# Patient Record
Sex: Male | Born: 1965 | Race: White | Hispanic: No | Marital: Married | Smoking: Never smoker
Health system: Southern US, Community
[De-identification: ages and names within clinical notes are randomized; demographics above are authoritative.]

## PROBLEM LIST (undated history)

## (undated) DIAGNOSIS — M109 Gout, unspecified: Secondary | ICD-10-CM

## (undated) DIAGNOSIS — F319 Bipolar disorder, unspecified: Secondary | ICD-10-CM

---

## 2003-07-12 ENCOUNTER — Emergency Department (HOSPITAL_COMMUNITY): Admission: EM | Admit: 2003-07-12 | Discharge: 2003-07-12 | Payer: Self-pay

## 2004-07-16 ENCOUNTER — Inpatient Hospital Stay (HOSPITAL_COMMUNITY): Admission: RE | Admit: 2004-07-16 | Discharge: 2004-07-18 | Payer: Self-pay | Admitting: Psychiatry

## 2004-07-16 ENCOUNTER — Ambulatory Visit: Payer: Self-pay | Admitting: Psychiatry

## 2008-11-02 ENCOUNTER — Encounter: Admission: RE | Admit: 2008-11-02 | Discharge: 2008-11-02 | Payer: Self-pay | Admitting: Orthopedic Surgery

## 2009-02-27 ENCOUNTER — Emergency Department (HOSPITAL_COMMUNITY): Admission: EM | Admit: 2009-02-27 | Discharge: 2009-02-27 | Payer: Self-pay | Admitting: Emergency Medicine

## 2010-12-24 NOTE — Discharge Summary (Signed)
Gordon Martin, BRUMMITT NO.:  [id number]   MEDICAL RECORD NO.:  [id number]          PATIENT TYPE:  IPS   LOCATION:  0407                          FACILITY:  [facility]   PHYSICIAN:  Jasmine Pang, M.D. DATE OF BIRTH:  1965-10-16   DATE OF ADMISSION:  07/16/2004  DATE OF DISCHARGE:  07/18/2004                                 DISCHARGE SUMMARY   IDENTIFICATION:  The patient was a 45 year old single Caucasian male who was  admitted on a voluntary basis on July 16, 2004.   REASON FOR ADMISSION:  The patient presented with a history of manic  behavior.  He had left home and put 40,000 miles on his truck and had spent  $5000.  He is off of his mood stabilizers.  He also reports he is sleep-  deprived.  He was grandiose and euphoric, although somewhat irritable.   PAST PSYCHIATRIC HISTORY:  This is the first psychiatric admission to the  [facility].  He was hospitalized at [facility] and [facility] x 1.   PHYSICAL EXAMINATION:  The patient's physical examination was done in the ED  prior to transfer here.  See this report.   LABORATORY DATA:  On admission, hemogram was within normal limits.  Routine  chemistry profile was within normal limits except for a slightly elevated  AST at 39 (0-37).  TSH, free T4 and free T3 were within normal limits.  Lithium was decreased at 0.32 (0.8-1.4).  The patient had admitted to being  off of his medicines.  An ECG was done which showed sinus bradycardia and  was otherwise normal.   HOSPITAL COURSE:  Upon admission, patient was started back on Lithobid 300  mg in the morning and 600 mg q.h.s.  He was also begun on Ativan 1 mg p.o.  or IM q.6h. p.r.n. agitation.  He was started on Ambien 10 mg p.o. q.h.s.  p.r.n. insomnia.  On July 17, 2004, he was begun on Geodon 20 mg IM for  emergency sedation.  He was refusing regular medications.  He was also given  Ativan 2 mg IM for acute agitation.  A second opinion was obtained in order  to  force medications.  We decided to force medicines if he became aggressive  and/or violent.   The patient became agitated and was demanding to go home.  He became  threatening and stated he would become violent if we did not let him go.  He  also had been inappropriate with another patient by putting his hand on her  bottom.  He was agitated enough that he required forced medications.  He was  verbally aggressive toward patients on the unit.  Several patients had to be  transferred off because of his harassment and disruptive behavior.  Due to  his violence, patient had to be transferred to [facility].   DISCHARGE DIAGNOSES:   AXIS I:  Bipolar disorder, manic.   AXIS II:  Deferred.   AXIS III:  None.   AXIS IV:  Other psychosocial problems.   AXIS V:  Global Assessment of Functioning current  was 20; admission status  25; highest past year 60.   DISCHARGE MEDICATIONS:  None were written since he was being transferred to  another hospital.  They will have a copy of the medications he is on.   POST-HOSPITAL CARE PLANS:  This will be determined by [facility].      BHS/MEDQ  D:  10/21/2004  T:  10/22/2004  Job:  [id number]

## 2014-05-30 ENCOUNTER — Emergency Department (HOSPITAL_COMMUNITY)
Admission: EM | Admit: 2014-05-30 | Discharge: 2014-05-31 | Disposition: A | Discharge status: Home / Self Care | Payer: Medicare Other | Attending: Emergency Medicine | Admitting: Emergency Medicine

## 2014-05-30 ENCOUNTER — Encounter (HOSPITAL_COMMUNITY): Payer: Self-pay | Admitting: Emergency Medicine

## 2014-05-30 ENCOUNTER — Emergency Department (HOSPITAL_COMMUNITY): Payer: Medicare Other

## 2014-05-30 DIAGNOSIS — R2243 Localized swelling, mass and lump, lower limb, bilateral: Secondary | ICD-10-CM | POA: Insufficient documentation

## 2014-05-30 DIAGNOSIS — R05 Cough: Secondary | ICD-10-CM | POA: Insufficient documentation

## 2014-05-30 DIAGNOSIS — Z79899 Other long term (current) drug therapy: Secondary | ICD-10-CM | POA: Insufficient documentation

## 2014-05-30 DIAGNOSIS — R600 Localized edema: Secondary | ICD-10-CM

## 2014-05-30 DIAGNOSIS — F3112 Bipolar disorder, current episode manic without psychotic features, moderate: Secondary | ICD-10-CM

## 2014-05-30 DIAGNOSIS — R059 Cough, unspecified: Secondary | ICD-10-CM

## 2014-05-30 HISTORY — DX: Gout, unspecified: M10.9

## 2014-05-30 HISTORY — DX: Bipolar disorder, unspecified: F31.9

## 2014-05-30 NOTE — ED Notes (Signed)
[facility]  With c/o bilat lower extremity swelling and need for medical clearance per IVC. Pt reports episodes of insomnia, non compliant with medication. Pt very argumentative with staff, yelling at times. Denies SI/HI

## 2014-05-30 NOTE — ED Provider Notes (Signed)
CSN: [id number]     Arrival date & time 05/30/14  2310 History   First MD Initiated Contact with Patient 05/30/14 2334     Chief Complaint  Patient presents with  . Leg Swelling  . Medical Clearance     (Consider location/radiation/quality/duration/timing/severity/associated sxs/prior Treatment) HPI Gordon Martin is a 48 y.o. male who presents for a [facility] for evaluation of leg swelling and lab work. Patient states he just got out of jail this morning. He was transported to [facility] for mental evaluation. He does have history of bipolar, he stopped taking his medications. He was evaluated and more Narcan sent here for further evaluation of his feet and lab work. Patient denies any complaints except for pain in bilateral feet and swelling. He states he's not sure when it started to be painful or swollen, he states that he has been upright and has not laid down in 8 days while in jail. He also reports he has not slept since 1993. He does have history of gout, and believes his swelling is from gout flare. He does report some pain, states it's "all over my feet." He is not sure if he has ever had swelling of his feet in the past. He denies any fever or chills. Denies any respiratory symptoms. No other complaints. He is under involuntary commitment for bizarre behavior by moderate.  No past medical history on file. No past surgical history on file. No family history on file. History  Substance Use Topics  . Smoking status: Not on file  . Smokeless tobacco: Not on file  . Alcohol Use: Not on file    Review of Systems  Constitutional: Negative for fever and chills.  Respiratory: Positive for cough. Negative for chest tightness and shortness of breath.   Cardiovascular: Positive for leg swelling. Negative for chest pain and palpitations.  Gastrointestinal: Negative for nausea, vomiting, abdominal pain, diarrhea and abdominal distention.  Genitourinary: Negative for dysuria, urgency, frequency  and hematuria.  Musculoskeletal: Positive for arthralgias. Negative for neck pain and neck stiffness.  Skin: Negative for rash.  Allergic/Immunologic: Negative for immunocompromised state.  Neurological: Negative for dizziness, weakness, light-headedness, numbness and headaches.  Psychiatric/Behavioral: Positive for hallucinations, behavioral problems and dysphoric mood. The patient is hyperactive.       Allergies  Haloperidol and related; Risperidone and related; and Zyprexa relprevv  Home Medications   Prior to Admission medications   Medication Sig Start Date End Date Taking? Authorizing Provider  ibuprofen (ADVIL,MOTRIN) 800 MG tablet Take 800 mg by mouth every 8 (eight) hours as needed for moderate pain.   Yes Historical Provider, MD  traZODone (DESYREL) 100 MG tablet Take 100 mg by mouth at bedtime.   Yes Historical Provider, MD   BP 145/68  Pulse 71  Temp(Src) 98 F (36.7 C) (Oral)  Resp 18  SpO2 99% Physical Exam  Nursing note and vitals reviewed. Constitutional: He appears well-developed and well-nourished. No distress.  HENT:  Head: Normocephalic and atraumatic.  Eyes: Conjunctivae are normal.  Neck: Neck supple.  Cardiovascular: Normal rate, regular rhythm and normal heart sounds.   Pulmonary/Chest: Effort normal. No respiratory distress. He has no wheezes. He has no rales.  Abdominal: Soft. Bowel sounds are normal. He exhibits no distension. There is no tenderness. There is no rebound.  Musculoskeletal:  Bilateral 3+ pitting edema of feet, ankles, lower legs. Diffuse tenderness to palpation. No specific joint swelling or tenderness, no erythema or bruising  Neurological: He is alert.  Skin: Skin is  warm and dry.    ED Course  Procedures (including critical care time) Labs Review Labs Reviewed  COMPREHENSIVE METABOLIC PANEL - Abnormal; Notable for the following:    Glucose, Bld 107 (*)    All other components within normal limits  LITHIUM LEVEL - Abnormal;  Notable for the following:    Lithium Lvl 1.54 (*)    All other components within normal limits  CBC WITH DIFFERENTIAL  ETHANOL  URINE RAPID DRUG SCREEN (HOSP PERFORMED)    Imaging Review Dg Chest 2 View  05/31/2014   CLINICAL DATA:  Bilateral lower extremity swelling.  Cough.  EXAM: CHEST  2 VIEW  COMPARISON:  None.  FINDINGS: The heart size and mediastinal contours are within normal limits. Both lungs are clear. The visualized skeletal structures are unremarkable.  IMPRESSION: No active cardiopulmonary disease.   Electronically Signed   By: Charlett NoseKevin  Dover M.D.   On: 05/31/2014 00:10     EKG Interpretation None      MDM   Final diagnoses:  Cough  Bilateral edema of lower extremity  Bipolar disorder, current episode manic without psychotic features, moderate   Pt with bilateral leg swelling for unknown time. Also for labs and lithium level. Pt is IVCed.  Labs placed. No signs of infection, doubt DVT, negative homans sign. CXR ordered.    1:15 AM Lithium level just slightly high. No concern for overdose, just got out of jail and [facility] straight to [facility]. No further treatment. Pt asymptomatic for it.  Leg swelling most likely dependent edema. No signs of infection, doubt DVT, doubt gout. Will start on lasix for few days, instructed to keep legs elevated. UA pending.    Filed Vitals:   05/30/14 2320 05/30/14 2355  BP: 145/68   Pulse: 71   Temp: 98 F (36.7 C)   TempSrc: Oral   Resp: 18   Height:  5\' 11"  (1.803 m)  Weight:  222 lb 2 oz (100.755 kg)  SpO2: 99%     1:30 AM Pt in NAD. Signed out at shift change pending drug screen. Pt to be d/c back to [facility] after drug screen obtained.   Lottie Musselatyana A Zondra Lawlor, PA-C 05/31/14 0130

## 2014-05-31 LAB — CBC WITH DIFFERENTIAL/PLATELET
Basophils Absolute: 0 10*3/uL (ref 0.0–0.1)
Basophils Relative: 1 % (ref 0–1)
Eosinophils Absolute: 0.3 10*3/uL (ref 0.0–0.7)
Eosinophils Relative: 5 % (ref 0–5)
HCT: 42.2 % (ref 39.0–52.0)
Hemoglobin: 15 g/dL (ref 13.0–17.0)
Lymphocytes Relative: 28 % (ref 12–46)
Lymphs Abs: 1.9 10*3/uL (ref 0.7–4.0)
MCH: 31.8 pg (ref 26.0–34.0)
MCHC: 35.5 g/dL (ref 30.0–36.0)
MCV: 89.4 fL (ref 78.0–100.0)
Monocytes Absolute: 0.6 10*3/uL (ref 0.1–1.0)
Monocytes Relative: 9 % (ref 3–12)
Neutro Abs: 4 10*3/uL (ref 1.7–7.7)
Neutrophils Relative %: 57 % (ref 43–77)
Platelets: 234 10*3/uL (ref 150–400)
RBC: 4.72 MIL/uL (ref 4.22–5.81)
RDW: 12.1 % (ref 11.5–15.5)
WBC: 6.9 10*3/uL (ref 4.0–10.5)

## 2014-05-31 LAB — COMPREHENSIVE METABOLIC PANEL
ALT: 30 U/L (ref 0–53)
AST: 31 U/L (ref 0–37)
Albumin: 4.3 g/dL (ref 3.5–5.2)
Alkaline Phosphatase: 85 U/L (ref 39–117)
Anion gap: 15 (ref 5–15)
BUN: 18 mg/dL (ref 6–23)
CO2: 20 mEq/L (ref 19–32)
Calcium: 9.3 mg/dL (ref 8.4–10.5)
Chloride: 104 mEq/L (ref 96–112)
Creatinine, Ser: 0.98 mg/dL (ref 0.50–1.35)
GFR calc Af Amer: >90 mL/min (ref >90)
GFR calc non Af Amer: >90 mL/min (ref >90)
Glucose, Bld: 107 mg/dL — ABNORMAL HIGH (ref 70–99)
Potassium: 4.1 mEq/L (ref 3.7–5.3)
Sodium: 139 mEq/L (ref 137–147)
Total Bilirubin: 0.4 mg/dL (ref 0.3–1.2)
Total Protein: 7.3 g/dL (ref 6.0–8.3)

## 2014-05-31 LAB — ETHANOL: Alcohol, Ethyl (B): <11 mg/dL (ref 0–11)

## 2014-05-31 LAB — RAPID URINE DRUG SCREEN, HOSP PERFORMED
Amphetamines: NOT DETECTED
Barbiturates: NOT DETECTED
Benzodiazepines: NOT DETECTED
Cocaine: NOT DETECTED
Opiates: NOT DETECTED
Tetrahydrocannabinol: NOT DETECTED

## 2014-05-31 LAB — LITHIUM LEVEL: Lithium Lvl: 1.54 mEq/L (ref 0.80–1.40)

## 2014-05-31 MED ORDER — FUROSEMIDE 40 MG PO TABS: 40.0000 mg | ORAL_TABLET | Freq: Every day | ORAL | Status: DC

## 2014-05-31 MED ORDER — FUROSEMIDE 40 MG PO TABS
40.0000 mg | ORAL_TABLET | Freq: Once | ORAL | Status: AC
  Administered 2014-05-31: 40 mg via ORAL
  Filled 2014-05-31: qty 1

## 2014-05-31 NOTE — ED Provider Notes (Signed)
Medical screening examination/treatment/procedure(s) were performed by non-physician practitioner and as supervising physician I was immediately available for consultation/collaboration.   EKG Interpretation None        Tomasita CrumbleAdeleke Kataleyah Carducci, MD 05/31/14 1357

## 2014-05-31 NOTE — ED Notes (Signed)
Critical lab value lithium is 1.54. Toniann FailWendy shea call to give critical lab value.

## 2014-05-31 NOTE — Discharge Instructions (Signed)
Take lasix for your leg swelling as prescribed. Follow up with primary care doctor for recheck in one week.   Edema Edema is an abnormal buildup of fluids in your bodytissues. Edema is somewhatdependent on gravity to pull the fluid to the lowest place in your body. That makes the condition more common in the legs and thighs (lower extremities). Painless swelling of the feet and ankles is common and becomes more likely as you get older. It is also common in looser tissues, like around your eyes.  When the affected area is squeezed, the fluid may move out of that spot and leave a dent for a few moments. This dent is called pitting.  CAUSES  There are many possible causes of edema. Eating too much salt and being on your feet or sitting for a long time can cause edema in your legs and ankles. Hot weather may make edema worse. Common medical causes of edema include:  Heart failure.  Liver disease.  Kidney disease.  Weak blood vessels in your legs.  Cancer.  An injury.  Pregnancy.  Some medications.  Obesity. SYMPTOMS  Edema is usually painless.Your skin may look swollen or shiny.  DIAGNOSIS  Your health care provider may be able to diagnose edema by asking about your medical history and doing a physical exam. You may need to have tests such as X-rays, an electrocardiogram, or blood tests to check for medical conditions that may cause edema.  TREATMENT  Edema treatment depends on the cause. If you have heart, liver, or kidney disease, you need the treatment appropriate for these conditions. General treatment may include:  Elevation of the affected body part above the level of your heart.  Compression of the affected body part. Pressure from elastic bandages or support stockings squeezes the tissues and forces fluid back into the blood vessels. This keeps fluid from entering the tissues.  Restriction of fluid and salt intake.  Use of a water pill (diuretic). These medications are  appropriate only for some types of edema. They pull fluid out of your body and make you urinate more often. This gets rid of fluid and reduces swelling, but diuretics can have side effects. Only use diuretics as directed by your health care provider. HOME CARE INSTRUCTIONS   Keep the affected body part above the level of your heart when you are lying down.   Do not sit still or stand for prolonged periods.   Do not put anything directly under your knees when lying down.  Do not wear constricting clothing or garters on your upper legs.   Exercise your legs to work the fluid back into your blood vessels. This may help the swelling go down.   Wear elastic bandages or support stockings to reduce ankle swelling as directed by your health care provider.   Eat a low-salt diet to reduce fluid if your health care provider recommends it.   Only take medicines as directed by your health care provider. SEEK MEDICAL CARE IF:   Your edema is not responding to treatment.  You have heart, liver, or kidney disease and notice symptoms of edema.  You have edema in your legs that does not improve after elevating them.   You have sudden and unexplained weight gain. SEEK IMMEDIATE MEDICAL CARE IF:   You develop shortness of breath or chest pain.   You cannot breathe when you lie down.  You develop pain, redness, or warmth in the swollen areas.   You have heart, liver,  or kidney disease and suddenly get edema.  You have a fever and your symptoms suddenly get worse. MAKE SURE YOU:   Understand these instructions.  Will watch your condition.  Will get help right away if you are not doing well or get worse. Document Released: 07/25/2005 Document Revised: 12/09/2013 Document Reviewed: 05/17/2013 [facility] Patient Information 2015 [employer] [place]. This information is not intended to replace advice given to you by your health care provider. Make sure you discuss any questions you have with  your health care provider.

## 2014-05-31 NOTE — ED Notes (Signed)
Bed: [id number] Expected date:  Expected time:  Means of arrival:  Comments: tr1

## 2014-05-31 NOTE — ED Notes (Signed)
Pt refused in/out catheter.

## 2014-06-01 ENCOUNTER — Encounter (HOSPITAL_COMMUNITY): Payer: Self-pay | Admitting: Emergency Medicine

## 2014-06-01 ENCOUNTER — Emergency Department (HOSPITAL_COMMUNITY)
Admission: EM | Admit: 2014-06-01 | Discharge: 2014-06-03 | Disposition: A | Discharge status: Home / Self Care | Payer: Medicare Other | Attending: Emergency Medicine | Admitting: Emergency Medicine

## 2014-06-01 DIAGNOSIS — F23 Brief psychotic disorder: Secondary | ICD-10-CM | POA: Insufficient documentation

## 2014-06-01 DIAGNOSIS — F3112 Bipolar disorder, current episode manic without psychotic features, moderate: Secondary | ICD-10-CM | POA: Diagnosis present

## 2014-06-01 DIAGNOSIS — F3130 Bipolar disorder, current episode depressed, mild or moderate severity, unspecified: Secondary | ICD-10-CM | POA: Diagnosis present

## 2014-06-01 DIAGNOSIS — Z79899 Other long term (current) drug therapy: Secondary | ICD-10-CM | POA: Insufficient documentation

## 2014-06-01 DIAGNOSIS — Z8739 Personal history of other diseases of the musculoskeletal system and connective tissue: Secondary | ICD-10-CM | POA: Insufficient documentation

## 2014-06-01 LAB — CBC
HCT: 40.8 % (ref 39.0–52.0)
Hemoglobin: 14.3 g/dL (ref 13.0–17.0)
MCH: 31.4 pg (ref 26.0–34.0)
MCHC: 35 g/dL (ref 30.0–36.0)
MCV: 89.7 fL (ref 78.0–100.0)
Platelets: 206 10*3/uL (ref 150–400)
RBC: 4.55 MIL/uL (ref 4.22–5.81)
RDW: 12.1 % (ref 11.5–15.5)
WBC: 6.1 10*3/uL (ref 4.0–10.5)

## 2014-06-01 LAB — ACETAMINOPHEN LEVEL: Acetaminophen (Tylenol), Serum: <15 ug/mL (ref 10–30)

## 2014-06-01 LAB — COMPREHENSIVE METABOLIC PANEL
ALT: 24 U/L (ref 0–53)
AST: 24 U/L (ref 0–37)
Albumin: 4 g/dL (ref 3.5–5.2)
Alkaline Phosphatase: 70 U/L (ref 39–117)
Anion gap: 12 (ref 5–15)
BUN: 14 mg/dL (ref 6–23)
CO2: 24 mEq/L (ref 19–32)
Calcium: 9.2 mg/dL (ref 8.4–10.5)
Chloride: 105 mEq/L (ref 96–112)
Creatinine, Ser: 1.08 mg/dL (ref 0.50–1.35)
GFR calc Af Amer: >90 mL/min (ref >90)
GFR calc non Af Amer: 79 mL/min — ABNORMAL LOW (ref >90)
Glucose, Bld: 86 mg/dL (ref 70–99)
Potassium: 4.6 mEq/L (ref 3.7–5.3)
Sodium: 141 mEq/L (ref 137–147)
Total Bilirubin: 0.4 mg/dL (ref 0.3–1.2)
Total Protein: 7 g/dL (ref 6.0–8.3)

## 2014-06-01 LAB — SALICYLATE LEVEL: Salicylate Lvl: <2 mg/dL — ABNORMAL LOW (ref 2.8–20.0)

## 2014-06-01 LAB — RAPID URINE DRUG SCREEN, HOSP PERFORMED
Amphetamines: NOT DETECTED
Barbiturates: NOT DETECTED
Benzodiazepines: NOT DETECTED
Cocaine: NOT DETECTED
Opiates: NOT DETECTED
Tetrahydrocannabinol: NOT DETECTED

## 2014-06-01 LAB — LITHIUM LEVEL: Lithium Lvl: 0.32 mEq/L — ABNORMAL LOW (ref 0.80–1.40)

## 2014-06-01 LAB — ETHANOL: Alcohol, Ethyl (B): 13 mg/dL — ABNORMAL HIGH (ref 0–11)

## 2014-06-01 MED ORDER — DIPHENHYDRAMINE HCL 25 MG PO CAPS
50.0000 mg | ORAL_CAPSULE | Freq: Once | ORAL | Status: AC
  Administered 2014-06-01: 50 mg via ORAL
  Filled 2014-06-01: qty 2

## 2014-06-01 MED ORDER — LORAZEPAM 1 MG PO TABS
2.0000 mg | ORAL_TABLET | Freq: Once | ORAL | Status: AC
  Administered 2014-06-01: 2 mg via ORAL
  Filled 2014-06-01: qty 2

## 2014-06-01 MED ORDER — LORAZEPAM 2 MG/ML IJ SOLN: 2.0000 mg | Freq: Once | INTRAMUSCULAR | Status: DC

## 2014-06-01 MED ORDER — ZIPRASIDONE MESYLATE 20 MG IM SOLR
20.0000 mg | Freq: Once | INTRAMUSCULAR | Status: AC
  Administered 2014-06-01: 20 mg via INTRAMUSCULAR
  Filled 2014-06-01: qty 20

## 2014-06-01 MED ORDER — DIPHENHYDRAMINE HCL 50 MG/ML IJ SOLN
50.0000 mg | Freq: Once | INTRAMUSCULAR | Status: DC
  Filled 2014-06-01: qty 1

## 2014-06-01 MED ORDER — LORAZEPAM 2 MG/ML IJ SOLN
2.0000 mg | Freq: Once | INTRAMUSCULAR | Status: DC
  Filled 2014-06-01: qty 1

## 2014-06-01 MED ORDER — DIPHENHYDRAMINE HCL 50 MG/ML IJ SOLN: 50.0000 mg | Freq: Once | INTRAMUSCULAR | Status: DC

## 2014-06-01 MED ORDER — STERILE WATER FOR INJECTION IJ SOLN
INTRAMUSCULAR | Status: AC
  Administered 2014-06-01: 10 mL
  Filled 2014-06-01: qty 10

## 2014-06-01 NOTE — ED Notes (Signed)
Per [facility]-Pt diagnosed with bipolar disorder. Prescribed lithium er and trazadone. Pt threatening [employer], [employer] [facility] members stating that he has addresses of the staff, blood will be shed. Pt continues to verbalize intent to harm judicial and correctional staff. His behavior is unpredictable and he has been non compliant with meds. Pt smearing feces at the jail and throwing it on others.

## 2014-06-01 NOTE — ED Provider Notes (Signed)
CSN: [id number]     Arrival date & time 06/01/14  1529 History   First MD Initiated Contact with Patient 06/01/14 1539     Chief Complaint  Patient presents with  . Medical Clearance     (Consider location/radiation/quality/duration/timing/severity/associated sxs/prior Treatment) HPI 48 year old [facility]. Apparently he was threatening staff and reportedly rubbed his feces on the wall in the form of math equations. He also blacked out a window with his feces. When asked about this he states he did not have padded pencil and has had to use his stool. The patient currently believes he works for [employer]. He is rambling about different things. The security guard from [facility] states that he believes patient is supposed to have a bed here ready for him.  Past Medical History  Diagnosis Date  . Bipolar disorder   . Gout    History reviewed. No pertinent past surgical history. History reviewed. No pertinent family history. History  Substance Use Topics  . Smoking status: Never Smoker   . Smokeless tobacco: Not on file  . Alcohol Use: No    Review of Systems  Unable to perform ROS: Psychiatric disorder      Allergies  Bee venom; Haloperidol and related; Risperidone and related; and Zyprexa relprevv  Home Medications   Prior to Admission medications   Medication Sig Start Date End Date Taking? Authorizing Provider  ibuprofen (ADVIL,MOTRIN) 800 MG tablet Take 800 mg by mouth every 8 (eight) hours as needed for moderate pain.   Yes Historical Provider, MD  traZODone (DESYREL) 100 MG tablet Take 100 mg by mouth at bedtime.   Yes Historical Provider, MD  furosemide (LASIX) 40 MG tablet Take 1 tablet (40 mg total) by mouth daily. 05/31/14   Tatyana A Kirichenko, PA-C   There were no vitals taken for this visit. Physical Exam  Nursing note and vitals reviewed. Constitutional: He is oriented to person, place, and time. He appears  well-developed and well-nourished.  Patient's arms and legs are both in handcuffs  HENT:  Head: Normocephalic and atraumatic.  Right Ear: External ear normal.  Left Ear: External ear normal.  Nose: Nose normal.  Eyes: Right eye exhibits no discharge. Left eye exhibits no discharge.  Neck: Neck supple.  Cardiovascular: Normal rate, regular rhythm, normal heart sounds and intact distal pulses.   Pulmonary/Chest: Effort normal.  Abdominal: Soft. There is no tenderness.  Musculoskeletal: He exhibits no edema.  Neurological: He is alert and oriented to person, place, and time.  Skin: Skin is warm and dry.  Psychiatric: He is agitated. Thought content is delusional.    ED Course  Procedures (including critical care time) Labs Review Labs Reviewed  COMPREHENSIVE METABOLIC PANEL - Abnormal; Notable for the following:    GFR calc non Af Amer 79 (*)    All other components within normal limits  ETHANOL - Abnormal; Notable for the following:    Alcohol, Ethyl (B) 13 (*)    All other components within normal limits  SALICYLATE LEVEL - Abnormal; Notable for the following:    Salicylate Lvl <2.0 (*)    All other components within normal limits  LITHIUM LEVEL - Abnormal; Notable for the following:    Lithium Lvl 0.32 (*)    All other components within normal limits  CBC  URINE RAPID DRUG SCREEN (HOSP PERFORMED)  ACETAMINOPHEN LEVEL    Imaging Review Dg Chest 2 View  05/31/2014   CLINICAL DATA:  Bilateral lower extremity  swelling.  Cough.  EXAM: CHEST  2 VIEW  COMPARISON:  None.  FINDINGS: The heart size and mediastinal contours are within normal limits. Both lungs are clear. The visualized skeletal structures are unremarkable.  IMPRESSION: No active cardiopulmonary disease.   Electronically Signed   By: Charlett NoseKevin  Dover M.D.   On: 05/31/2014 00:10     EKG Interpretation   Date/Time:  Sunday June 01 2014 17:28:20 EDT Ventricular Rate:  55 PR Interval:  193 QRS Duration: 107 QT  Interval:  452 QTC Calculation: 432 R Axis:   56 Text Interpretation:  Sinus bradycardia No significant change since last  tracing Confirmed by Ticia Virgo  MD, Taralyn Ferraiolo ([id number]) on 06/01/2014 5:33:00 PM      MDM   Final diagnoses:  Acute psychosis    Patient with acute psychosis. Quite agitated and required IM medicines for control. After control he was sent to the psych ED was evaluated by TTS and will require inpatient psychiatric stabilization.    Audree CamelScott T Andrez Lieurance, MD 06/01/14 [phone]

## 2014-06-01 NOTE — BH Assessment (Signed)
Assessment Note  Gordon Martin is an 48 y.o. male [facility] by [facility] due to aggressive behaviors. It has been reported that pt has been threatening staff and he reportedly rubbed his feces on the wall in the form of math equations. It has also been reported that he smeared his feces over a window. It has also been reported that pt has been urinating on the floor, pulling wiring out the vents, stuffing sheets I his rectum and refusing his medication.  Pt is currently sedated and a thorough assessment could not be completed at this time. Pt denied SI and HI at this time but did not give a response for psychosis. Pt reported that his sleep has decreased and he gets approximately 4 hours of sleep.  Please see [facility]. Inpatient treatment has been recommended.  Axis I: Bipolar, Manic  Past Medical History:  Past Medical History  Diagnosis Date  . Bipolar disorder   . Gout     History reviewed. No pertinent past surgical history.  Family History: History reviewed. No pertinent family history.  Social History:  reports that he has never smoked. He does not have any smokeless tobacco history on file. He reports that he does not drink alcohol or use illicit drugs.  Additional Social History:  Alcohol / Drug Use History of alcohol / drug use?: No history of alcohol / drug abuse  CIWA: CIWA-Ar BP: 127/84 mmHg Pulse Rate: 60 COWS:    Allergies:  Allergies  Allergen Reactions  . Bee Venom     Hornets  . Haloperidol And Related Other (See Comments)    Lock jaw  . Risperidone And Related Other (See Comments)    unknown  . Zyprexa Relprevv [Olanzapine Pamoate] Other (See Comments)    unknown    Home Medications:  (Not in a hospital admission)  OB/GYN Status:  No LMP for male patient.  General Assessment Data [facility] of [facility] Is this a Tele or Face-to-Face Assessment?: Face-to-Face Is this an Initial Assessment or a Re-assessment  for this encounter?: Initial Assessment Living Arrangements: Alone Can pt return to current living arrangement?: Yes Admission Status: Involuntary Is patient capable of signing voluntary admission?: Yes Transfer from: Home Referral Source: Self/Family/Friend     [facility] Living Arrangements: Alone Name of Psychiatrist: No provider reported Name of Therapist: No provider reported  Education Status Is patient currently in school?: No  Risk to self with the past 6 months Suicidal Ideation: No Suicidal Intent: No Is patient at risk for suicide?: No Suicidal Plan?: No Access to Means: No What has been your use of drugs/alcohol within the last 12 months?: No alcohol or drug use reported Previous Attempts/Gestures: No How many times?: 0 Other Self Harm Risks: No other self harm risk identified at this time.  Triggers for Past Attempts: None known Intentional Self Injurious Behavior: None Family Suicide History: No Recent stressful life event(s): Financial Problems Persecutory voices/beliefs?: No Depression:  (unable to assess at this time. ) Substance abuse history and/or treatment for substance abuse?: No Suicide prevention information given to non-admitted patients: Not applicable  Risk to Others within the past 6 months Homicidal Ideation: No-Not Currently/Within Last 6 Months Thoughts of Harm to Others: No-Not Currently Present/Within Last 6 Months Current Homicidal Intent: No-Not Currently/Within Last 6 Months Current Homicidal Plan: No-Not Currently/Within Last 6 Months Access to Homicidal Means: No Identified Victim: N/A History of harm to others?:  (Unable to assess  at this time. ) Assessment of Violence: None Noted Violent Behavior Description: No violent behaviors observed at this time.  Does patient have access to weapons?: No Criminal Charges Pending?: No Does patient have a court date: No  Psychosis Hallucinations: None noted Delusions: None  noted  Mental Status Report Appear/Hygiene: In scrubs Eye Contact: Poor Motor Activity: Freedom of movement Speech: Incoherent;Slurred Level of Consciousness: Sedated Mood: Euthymic Affect: Appropriate to circumstance Anxiety Level: None Thought Processes: Unable to Assess Judgement: Unable to Assess Orientation: Person Obsessive Compulsive Thoughts/Behaviors: Unable to Assess  Cognitive Functioning Concentration: Unable to Assess Memory: Unable to Assess IQ: Average Insight: Unable to Assess Impulse Control: Unable to Assess Appetite:  (unable to assesss) Weight Loss: 0 Weight Gain: 0 Sleep: Decreased Total Hours of Sleep: 4 Vegetative Symptoms: None  ADLScreening [facility]) Patient's cognitive ability adequate to safely complete daily activities?: Yes Patient able to express need for assistance with ADLs?: Yes Independently performs ADLs?: Yes (appropriate for developmental age)  Prior Inpatient Therapy Prior Inpatient Therapy: Yes Prior Therapy Dates: 2005 Prior [facility]  Prior Outpatient Therapy Prior Outpatient Therapy:  (unable to assess)  ADL Screening (condition at time of admission) Patient's cognitive ability adequate to safely complete daily activities?: Yes Patient able to express need for assistance with ADLs?: Yes Independently performs ADLs?: Yes (appropriate for developmental age)       Abuse/Neglect Assessment (Assessment to be complete while patient is alone) Physical Abuse: Denies Verbal Abuse: Denies Sexual Abuse: Denies Exploitation of patient/patient's resources: Denies Self-Neglect: Denies Values / Beliefs Cultural Requests During Hospitalization: None Spiritual Requests During Hospitalization: None   Advance Directives (For Healthcare) Does patient have an advance directive?: No Would patient like information on creating an advanced directive?: No - patient declined information     Additional Information 1:1 In Past 12 Months?: No CIRT Risk: No Elopement Risk: No     Disposition:  Disposition Initial Assessment Completed for this Encounter: Yes Disposition of Patient: Inpatient treatment program Type of inpatient treatment program: Adult  On Site Evaluation by:   Reviewed with Physician:    Lahoma RockerSims,Verle Brillhart S 06/01/2014 10:51 PM

## 2014-06-01 NOTE — ED Notes (Signed)
[facility] Expected date: 06/01/14 Expected time:  Means of arrival:  Comments: Hold for room 4

## 2014-06-01 NOTE — Progress Notes (Signed)
CSW spoke with Luz Brazenarlie, RN with [facility] to collect collateral information.  She reports that patient was sent back to the ED a second time because of the increase in bizarre behaviors and critical lithium levels.  She reports [facility] he was exhibiting bizarre behaviors.  Patient was taking his roommate's belongings and hiding them, urinating on the floors, spearing feces on the walls, writing mathematic equations on the walls in feces, covering self in feces, blacking out the windows in feces, took the wiring out the vents, stuffed sheets in rectum, and refusing medications.  Patient was unmanageable in their facility therefore he was sent to the ED psych hold.    Patient is a [employer] Veteran this was confirmed with SissetonGabrielle AOD [phone].  She reports that the [facility] but has active benefits.    CSW attempted to assess but patient was being moved so will try again.    Maryelizabeth Rowanressa Dorothyann Mourer, MSW, LCSWA Evening Clinical Social Worker [phone]

## 2014-06-01 NOTE — BH Assessment (Signed)
Assessment completed. Consulted Janann Augustori Burkett, NP who recommend inpatient treatment. Dr. Criss AlvineGoldston has notified of the recommendation.

## 2014-06-02 ENCOUNTER — Encounter (HOSPITAL_COMMUNITY): Payer: Self-pay | Admitting: Psychiatry

## 2014-06-02 DIAGNOSIS — F3112 Bipolar disorder, current episode manic without psychotic features, moderate: Secondary | ICD-10-CM | POA: Diagnosis present

## 2014-06-02 DIAGNOSIS — F3130 Bipolar disorder, current episode depressed, mild or moderate severity, unspecified: Secondary | ICD-10-CM | POA: Diagnosis present

## 2014-06-02 MED ORDER — LORAZEPAM 1 MG PO TABS
1.0000 mg | ORAL_TABLET | Freq: Three times a day (TID) | ORAL | Status: DC | PRN
Start: 2014-06-02 — End: 2014-06-03
  Administered 2014-06-02 – 2014-06-03 (×3): 1 mg via ORAL
  Filled 2014-06-02 (×3): qty 1

## 2014-06-02 MED ORDER — IBUPROFEN 200 MG PO TABS: 600.0000 mg | ORAL_TABLET | Freq: Three times a day (TID) | ORAL | Status: DC | PRN

## 2014-06-02 MED ORDER — QUETIAPINE FUMARATE 50 MG PO TABS
50.0000 mg | ORAL_TABLET | Freq: Every day | ORAL | Status: DC
  Administered 2014-06-02: 50 mg via ORAL

## 2014-06-02 MED ORDER — NICOTINE 21 MG/24HR TD PT24: 21.0000 mg | MEDICATED_PATCH | Freq: Every day | TRANSDERMAL | Status: DC

## 2014-06-02 MED ORDER — ZOLPIDEM TARTRATE 5 MG PO TABS
5.0000 mg | ORAL_TABLET | Freq: Every evening | ORAL | Status: DC | PRN
  Administered 2014-06-02: 5 mg via ORAL
  Filled 2014-06-02: qty 1

## 2014-06-02 MED ORDER — DIVALPROEX SODIUM 500 MG PO DR TAB
DELAYED_RELEASE_TABLET | ORAL | Status: AC
  Filled 2014-06-02: qty 1

## 2014-06-02 MED ORDER — TRAZODONE HCL 100 MG PO TABS
100.0000 mg | ORAL_TABLET | Freq: Every day | ORAL | Status: DC
Start: 2014-06-02 — End: 2014-06-02

## 2014-06-02 MED ORDER — ONDANSETRON HCL 4 MG PO TABS: 4.0000 mg | ORAL_TABLET | Freq: Three times a day (TID) | ORAL | Status: DC | PRN

## 2014-06-02 MED ORDER — DIVALPROEX SODIUM 500 MG PO DR TAB
500.0000 mg | DELAYED_RELEASE_TABLET | Freq: Two times a day (BID) | ORAL | Status: DC
  Filled 2014-06-02: qty 1

## 2014-06-02 MED ORDER — QUETIAPINE FUMARATE 50 MG PO TABS
ORAL_TABLET | ORAL | Status: AC
  Filled 2014-06-02: qty 1

## 2014-06-02 MED ORDER — FUROSEMIDE 40 MG PO TABS
40.0000 mg | ORAL_TABLET | Freq: Every day | ORAL | Status: DC
  Administered 2014-06-02 – 2014-06-03 (×2): 40 mg via ORAL
  Filled 2014-06-02 (×3): qty 1

## 2014-06-02 MED ORDER — ALUM & MAG HYDROXIDE-SIMETH 200-200-20 MG/5ML PO SUSP: 30.0000 mL | ORAL | Status: DC | PRN

## 2014-06-02 MED ORDER — ACETAMINOPHEN 325 MG PO TABS: 650.0000 mg | ORAL_TABLET | ORAL | Status: DC | PRN

## 2014-06-02 NOTE — ED Notes (Signed)
Pt is manic pacing in the hall and speaking from one subject to another. Offered prn ativan for anxiety and agitatation. Pt refused and says that he takes trazadone for sleep. 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 Pt reports that he can teach people to fly a helicopter in 30 minutes. He then quickly changes to another subject.

## 2014-06-02 NOTE — Consult Note (Signed)
[facility] Face-to-Face Psychiatry Consult   Reason for Consult:  Mania Referring Physician:  EDP  Gordon Martin is an 48 y.o. male. Total Time spent with patient: 45 minutes  Assessment: AXIS I:  Bipolar, Manic AXIS II:  Deferred AXIS III:   Past Medical History  Diagnosis Date  . Bipolar disorder   . Gout    AXIS IV:  other psychosocial or environmental problems, problems related to social environment and problems with primary support group AXIS V:  21-30 behavior considerably influenced by delusions or hallucinations OR serious impairment in judgment, communication OR inability to function in almost all areas  Plan:  Recommend psychiatric Inpatient admission when medically cleared.  Dr. Darleene Martin assessed the patient and concurs with the plan.  Subjective:   Gordon Martin is a 48 y.o. male patient admitted with bipolar disorder and mania.  HPI:  48 y.o. male .   HPI Elements:   Location:  generalized. Quality:  acute. Severity:  severe. Timing:  constant. Duration:  one week. Context:  stressors.  Past Psychiatric History: Past Medical History  Diagnosis Date  . Bipolar disorder   . Gout     reports that he has never smoked. He does not have any smokeless tobacco history on file. He reports that he does not drink alcohol or use illicit drugs. History reviewed. No pertinent family history. Family History Substance Abuse:  (unable to assess) Family Supports:  (Unable to assess) Living Arrangements: Alone Can pt return to current living arrangement?: Yes Abuse/Neglect [facility]) Physical Abuse: Denies Verbal Abuse: Denies Sexual Abuse: Denies Allergies:   Allergies  Allergen Reactions  . Bee Venom     Hornets  . Haloperidol And Related Other (See Comments)    Lock jaw  . Risperidone And Related Other (See Comments)    unknown  . Zyprexa Relprevv [Olanzapine Pamoate] Other (See Comments)    unknown    ACT Assessment Complete:  Yes:    Educational Status    Risk to  Self: Risk to self with the past 6 months Suicidal Ideation: No Suicidal Intent: No Is patient at risk for suicide?: No Suicidal Plan?: No Access to Means: No What has been your use of drugs/alcohol within the last 12 months?: No alcohol or drug use reported Previous Attempts/Gestures: No How many times?: 0 Other Self Harm Risks: No other self harm risk identified at this time.  Triggers for Past Attempts: None known Intentional Self Injurious Behavior: None Family Suicide History: No Recent stressful life event(s): Financial Problems Persecutory voices/beliefs?: No Depression:  (unable to assess at this time. ) Substance abuse history and/or treatment for substance abuse?: No Suicide prevention information given to non-admitted patients: Not applicable  Risk to Others: Risk to Others within the past 6 months Homicidal Ideation: No-Not Currently/Within Last 6 Months Thoughts of Harm to Others: No-Not Currently Present/Within Last 6 Months Current Homicidal Intent: No-Not Currently/Within Last 6 Months Current Homicidal Plan: No-Not Currently/Within Last 6 Months Access to Homicidal Means: No Identified Victim: N/A History of harm to others?:  (Unable to assess at this time. ) Assessment of Violence: None Noted Violent Behavior Description: No violent behaviors observed at this time.  Does patient have access to weapons?: No Criminal Charges Pending?: No Does patient have a court date: No  Abuse: Abuse/Neglect Assessment (Assessment to be complete while patient is alone) Physical Abuse: Denies Verbal Abuse: Denies Sexual Abuse: Denies Exploitation of patient/patient's resources: Denies Self-Neglect: Denies  Prior Inpatient Therapy: Prior Inpatient Therapy Prior  Inpatient Therapy: Yes Prior Therapy Dates: 2005 Prior Therapy Facilty/Provider(s): [facility]  Prior Outpatient Therapy: Prior Outpatient Therapy Prior Outpatient Therapy:  (unable to assess)  Additional Information:  Additional Information 1:1 In Past 12 Months?: No CIRT Risk: No Elopement Risk: No                  Objective: Blood pressure 131/72, pulse 103, temperature 98.6 F (37 C), temperature source Oral, resp. rate 20, SpO2 97.00%.There is no weight on file to calculate BMI. Results for orders placed during the hospital encounter of 06/01/14 (from the past 72 hour(s))  CBC     Status: None   Collection Time    06/01/14  4:26 PM      Result Value Ref Range   WBC 6.1  4.0 - 10.5 K/uL   RBC 4.55  4.22 - 5.81 MIL/uL   Hemoglobin 14.3  13.0 - 17.0 g/dL   HCT 40.8  39.0 - 52.0 %   MCV 89.7  78.0 - 100.0 fL   MCH 31.4  26.0 - 34.0 pg   MCHC 35.0  30.0 - 36.0 g/dL   RDW 12.1  11.5 - 15.5 %   Platelets 206  150 - 400 K/uL  COMPREHENSIVE METABOLIC PANEL     Status: Abnormal   Collection Time    06/01/14  4:26 PM      Result Value Ref Range   Sodium 141  137 - 147 mEq/L   Potassium 4.6  3.7 - 5.3 mEq/L   Chloride 105  96 - 112 mEq/L   CO2 24  19 - 32 mEq/L   Glucose, Bld 86  70 - 99 mg/dL   BUN 14  6 - 23 mg/dL   Creatinine, Ser 1.08  0.50 - 1.35 mg/dL   Calcium 9.2  8.4 - 10.5 mg/dL   Total Protein 7.0  6.0 - 8.3 g/dL   Albumin 4.0  3.5 - 5.2 g/dL   AST 24  0 - 37 U/L   Comment: SLIGHT HEMOLYSIS     HEMOLYSIS AT THIS LEVEL MAY AFFECT RESULT   ALT 24  0 - 53 U/L   Alkaline Phosphatase 70  39 - 117 U/L   Total Bilirubin 0.4  0.3 - 1.2 mg/dL   GFR calc non Af Amer 79 (*) >90 mL/min   GFR calc Af Amer >90  >90 mL/min   Comment: (NOTE)     The eGFR has been calculated using the CKD EPI equation.     This calculation has not been validated in all clinical situations.     eGFR's persistently <90 mL/min signify possible Chronic Kidney     Disease.   Anion gap 12  5 - 15  ETHANOL     Status: Abnormal   Collection Time    06/01/14  4:26 PM      Result Value Ref Range   Alcohol, Ethyl (B) 13 (*) 0 - 11 mg/dL   Comment:            LOWEST DETECTABLE LIMIT FOR     SERUM  ALCOHOL IS 11 mg/dL     FOR MEDICAL PURPOSES ONLY  SALICYLATE LEVEL     Status: Abnormal   Collection Time    06/01/14  4:26 PM      Result Value Ref Range   Salicylate Lvl <5.0 (*) 2.8 - 20.0 mg/dL  ACETAMINOPHEN LEVEL     Status: None   Collection Time    06/01/14  4:26 PM      Result Value Ref Range   Acetaminophen (Tylenol), Serum <15.0  10 - 30 ug/mL   Comment:            THERAPEUTIC CONCENTRATIONS VARY     SIGNIFICANTLY. A RANGE OF 10-30     ug/mL MAY BE AN EFFECTIVE     CONCENTRATION FOR MANY PATIENTS.     HOWEVER, SOME ARE BEST TREATED     AT CONCENTRATIONS OUTSIDE THIS     RANGE.     ACETAMINOPHEN CONCENTRATIONS     >150 ug/mL AT 4 HOURS AFTER     INGESTION AND >50 ug/mL AT 12     HOURS AFTER INGESTION ARE     OFTEN ASSOCIATED WITH TOXIC     REACTIONS.  LITHIUM LEVEL     Status: Abnormal   Collection Time    06/01/14  4:26 PM      Result Value Ref Range   Lithium Lvl 0.32 (*) 0.80 - 1.40 mEq/L  URINE RAPID DRUG SCREEN (HOSP PERFORMED)     Status: None   Collection Time    06/01/14  5:26 PM      Result Value Ref Range   Opiates NONE DETECTED  NONE DETECTED   Cocaine NONE DETECTED  NONE DETECTED   Benzodiazepines NONE DETECTED  NONE DETECTED   Amphetamines NONE DETECTED  NONE DETECTED   Tetrahydrocannabinol NONE DETECTED  NONE DETECTED   Barbiturates NONE DETECTED  NONE DETECTED   Comment:            DRUG SCREEN FOR MEDICAL PURPOSES     ONLY.  IF CONFIRMATION IS NEEDED     FOR ANY PURPOSE, NOTIFY LAB     WITHIN 5 DAYS.                LOWEST DETECTABLE LIMITS     FOR URINE DRUG SCREEN     Drug Class       Cutoff (ng/mL)     Amphetamine      1000     Barbiturate      200     Benzodiazepine   196     Tricyclics       222     Opiates          300     Cocaine          300     THC              50   Labs are reviewed and are pertinent for no medical issues.  Current Facility-Administered Medications  Medication Dose Route Frequency Provider Last Rate Last  Dose  . acetaminophen (TYLENOL) tablet 650 mg  650 mg Oral L7L PRN Delora Fuel, MD      . alum & mag hydroxide-simeth (MAALOX/MYLANTA) 200-200-20 MG/5ML suspension 30 mL  30 mL Oral PRN Delora Fuel, MD      . divalproex (DEPAKOTE) DR tablet 500 mg  500 mg Oral BID AC Waylan Boga, NP      . furosemide (LASIX) tablet 40 mg  40 mg Oral Daily Delora Fuel, MD   40 mg at 89/21/19 1001  . ibuprofen (ADVIL,MOTRIN) tablet 600 mg  600 mg Oral E1D PRN Delora Fuel, MD      . LORazepam (ATIVAN) tablet 1 mg  1 mg Oral E0C PRN Delora Fuel, MD      . ondansetron [facility]) tablet 4 mg  4 mg Oral X4G PRN Delora Fuel, MD      .  traZODone (DESYREL) tablet 100 mg  100 mg Oral QHS Delora Fuel, MD      . zolpidem [facility]) tablet 5 mg  5 mg Oral QHS PRN Delora Fuel, MD       Current Outpatient Prescriptions  Medication Sig Dispense Refill  . ibuprofen (ADVIL,MOTRIN) 800 MG tablet Take 800 mg by mouth every 8 (eight) hours as needed for moderate pain.      . traZODone (DESYREL) 100 MG tablet Take 100 mg by mouth at bedtime.      . furosemide (LASIX) 40 MG tablet Take 1 tablet (40 mg total) by mouth daily.  10 tablet  0    Psychiatric Specialty Exam:     Blood pressure 131/72, pulse 103, temperature 98.6 F (37 C), temperature source Oral, resp. rate 20, SpO2 97.00%.There is no weight on file to calculate BMI.  General Appearance: Casual  Eye Contact::  Fair  Speech:  Pressured  Volume:  Normal  Mood:  Euphoric  Affect:  Blunt  Thought Process:  Tangential  Orientation:  Full (Time, Place, and Person)  Thought Content:  WDL  Suicidal Thoughts:  No  Homicidal Thoughts:  No  Memory:  Immediate;   Fair Recent;   Fair Remote;   Fair  Judgement:  Impaired  Insight:  Lacking  Psychomotor Activity:  Normal  Concentration:  Fair  Recall:  [employer] [place]: Fair  Akathisia:  no  Handed:  Right  AIMS (if indicated):     Assets:  Catering manager Housing Leisure  Time Physical Health Resilience Social Support  Sleep:      Musculoskeletal: Strength & Muscle Tone: within normal limits Gait & Station: normal Patient leans: N/A  Treatment Plan Summary: Daily contact with patient to assess and evaluate symptoms and progress in treatment Medication management; admit to inpatient psychiatry for stabilization  Waylan Boga, [place] 06/02/2014 2:44 PM  Patient seen, evaluated and I agree with notes by Nurse Practitioner. Corena Pilgrim, MD

## 2014-06-02 NOTE — ED Notes (Signed)
Gave pt 2 bottles of soap, 2 towels and 2 rags for a shower.
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
## 2014-06-02 NOTE — Progress Notes (Signed)
Pt has been assessed and psychiatry is recommending inpatient placement for treatment. Pt is a Thomasene RippleVeteran [facility]. [facility] [place] [facility] [place] [facility]. SW requesting a transfer packet in order to seek placement at their facility. Paperwork faxed and given to [facility] CSW, Olga CoasterKristen Reed. VA reported that they have availability and will await pt.'s information.   Derrell Lollingoris Demarkus Remmel, MSW  Social Worker [phone]
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
## 2014-06-03 DIAGNOSIS — F3110 Bipolar disorder, current episode manic without psychotic features, unspecified: Secondary | ICD-10-CM

## 2014-06-03 MED ORDER — QUETIAPINE FUMARATE 100 MG PO TABS: 200.0000 mg | ORAL_TABLET | Freq: Every day | ORAL | Status: DC

## 2014-06-03 MED ORDER — DIVALPROEX SODIUM 500 MG PO DR TAB: 500.0000 mg | DELAYED_RELEASE_TABLET | Freq: Two times a day (BID) | ORAL | Status: DC

## 2014-06-03 MED ORDER — QUETIAPINE FUMARATE 200 MG PO TABS: 200.0000 mg | ORAL_TABLET | Freq: Every day | ORAL | Status: DC

## 2014-06-03 NOTE — BH Assessment (Signed)
Patient accepted to [facility] by Bonnielee HaffBrent Fuverland, MD. Patient under IVC and will be [place] via sheriff [place] [facility] [place]. Pt will need to be transported to the ER upon [place] [facility] [facility] [place] Nursing report # [phone] ext. [phone].

## 2014-06-03 NOTE — ED Notes (Signed)
Patient pacing the hallway.  He is continuously writing notes to this Clinical research associatewriter.  His thoughts are disorganized and has flight of ideas.  His speech is rapid, pressured, tangental.  He denies any SI/HI/AVH.  He is also resisting his medication stating, "I'm not taking depakote.  I may take Lithium, but only 300 mg."  "You can tell the doctor that."  He came up to nursing station saying, "there is that [place] lady.  She's getting a porshe."

## 2014-06-03 NOTE — ED Notes (Signed)
Patient put depakote in his mouth and proceeded to spit it out refusing to take it.

## 2014-06-03 NOTE — ED Notes (Signed)
Patient to be transferred after 1900 by sheriff's department to [facility].  Report given to [facility] [phone]. [phone].  Please call her when sheriff [facility]s [facility].  Patient has written on the wall with crayon that he dug out of the trash.
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
## 2014-06-03 NOTE — BH Assessment (Signed)
06/03/2014  [facility]. [facility] No appropriate beds at this time. TTS to seek outside placement. Patient is also a [employer]an. [facility] in order to start placement process. Beds were available yesterday. Information was sent to Sarajane Jewslaura Almond, RN, PT Transfer Coordinator [phone] ext [phone]. Writer [facility] to [facility] with referral made yesterday. Per Sarajane JewsLaura Almond, RN, PT the packet was never received. Writer refaxed to  [phone].

## 2014-06-03 NOTE — ED Notes (Signed)
Patient refused vitals.

## 2014-06-03 NOTE — Consult Note (Signed)
  Psychiatric Specialty Exam: Physical Exam  ROS  Blood pressure 123/81, pulse 110, temperature 97.4 F (36.3 C), temperature source Oral, resp. rate 18, SpO2 96.00%.There is no weight on file to calculate BMI.  General Appearance: Casual  Eye Contact::  Fair  Speech:  Clear and Coherent and Pressured  Volume:  Normal  Mood:  Euphoric  Affect:  Congruent  Thought Process:  Circumstantial, Irrelevant and Tangential  Orientation:  Full (Time, Place, and Person)  Thought Content:  Negative  Suicidal Thoughts:  No  Homicidal Thoughts:  No  Memory:  Immediate;   Good Recent;   Good Remote;   Good  Judgement:  Impaired  Insight:  Lacking  Psychomotor Activity:  Increased  Concentration:  Fair  Recall:  Good  Akathisia:  Negative  Handed:  Right  AIMS (if indicated):     Assets:  Communication Skills  Sleep:   adequate  Mr Sheral FlowBentley remains manic.  His walls, bedside stand and bed are covered with pictures of women who are watching us he says.  He is constantly pacing, on the phone, gets loud quickly in an irritable way but it blows over quickly.  He talks rapidly in an irrelevant, circumstantial and tangential way.  He says he was "tortured" in jail.  "They are trying to kill me". " They means all the companies-no batteries in my vehicles- 2 [employer] trucks, 3 VW's, I paid $1000 and paid int it $ 12,000," etc, etc.  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  The plan remains to seek an inpatient bed for treatment of his bipolar mania.  He refuses Depakote but will take only 300 mg of Lithium he says.  At this point will increase the Seroquel and leave the lithium because of the toxic level it reached in the past.

## 2014-06-03 NOTE — Discharge Instructions (Signed)
Bipolar Disorder °Bipolar disorder is a mental illness. The term bipolar disorder actually is used to describe a group of disorders that all share varying degrees of emotional highs and lows that can interfere with daily functioning, such as work, school, or relationships. Bipolar disorder also can lead to drug abuse, hospitalization, and suicide. °The emotional highs of bipolar disorder are periods of elation or irritability and high energy. These highs can range from a mild form (hypomania) to a severe form (mania). People experiencing episodes of hypomania may appear energetic, excitable, and highly productive. People experiencing mania may behave impulsively or erratically. They often make poor decisions. They may have difficulty sleeping. The most severe episodes of mania can involve having very distorted beliefs or perceptions about the world and seeing or hearing things that are not real (psychotic delusions and hallucinations).  °The emotional lows of bipolar disorder (depression) also can range from mild to severe. Severe episodes of bipolar depression can involve psychotic delusions and hallucinations. °Sometimes people with bipolar disorder experience a state of mixed mood. Symptoms of hypomania or mania and depression are both present during this mixed-mood episode. °SIGNS AND SYMPTOMS °There are signs and symptoms of the episodes of hypomania and mania as well as the episodes of depression. The signs and symptoms of hypomania and mania are similar but vary in severity. They include: °· Inflated self-esteem or feeling of increased self-confidence. °· Decreased need for sleep. °· Unusual talkativeness (rapid or pressured speech) or the feeling of a need to keep talking. °· Sensation of racing thoughts or constant talking, with quick shifts between topics that may or may not be related (flight of ideas). °· Decreased ability to focus or concentrate. °· Increased purposeful activity, such as work, studies,  or social activity, or nonproductive activity, such as pacing, squirming and fidgeting, or finger and toe tapping. °· Impulsive behavior and use of poor judgment, resulting in high-risk activities, such as having unprotected sex or spending excessive amounts of money. °Signs and symptoms of depression include the following:  °· Feelings of sadness, hopelessness, or helplessness. °· Frequent or uncontrollable episodes of crying. °· Lack of feeling anything or caring about anything. °· Difficulty sleeping or sleeping too much.  °· Inability to enjoy the things you used to enjoy.   °· Desire to be alone all the time.   °· Feelings of guilt or worthlessness.  °· Lack of energy or motivation.   °· Difficulty concentrating, remembering, or making decisions.  °· Change in appetite or weight beyond normal fluctuations. °· Thoughts of death or the desire to harm yourself. °DIAGNOSIS  °Bipolar disorder is diagnosed through an assessment by your caregiver. Your caregiver will ask questions about your emotional episodes. There are two main types of bipolar disorder. People with type I bipolar disorder have manic episodes with or without depressive episodes. People with type II bipolar disorder have hypomanic episodes and major depressive episodes, which are more serious than mild depression. The type of bipolar disorder you have can make an important difference in how your illness is monitored and treated. °Your caregiver may ask questions about your medical history and use of alcohol or drugs, including prescription medication. Certain medical conditions and substances also can cause emotional highs and lows that resemble bipolar disorder (secondary bipolar disorder).  °TREATMENT  °Bipolar disorder is a long-term illness. It is best controlled with continuous treatment rather than treatment only when symptoms occur. The following treatments can be prescribed for bipolar disorders: °· Medication--Medication can be prescribed by  a doctor that   is an expert in treating mental disorders (psychiatrists). Medications called mood stabilizers are usually prescribed to help control the illness. Other medications are sometimes added if symptoms of mania, depression, or psychotic delusions and hallucinations occur despite the use of a mood stabilizer.  Talk therapy--Some forms of talk therapy are helpful in providing support, education, and guidance. A combination of medication and talk therapy is best for managing the disorder over time. A procedure in which electricity is applied to your brain through your scalp (electroconvulsive therapy) is used in cases of severe mania when medication and talk therapy do not work or work too slowly. Document Released: 10/31/2000 Document Revised: 11/19/2012 Document Reviewed: 08/20/2012 [facility] Patient Information 2015 [place]. This information is not intended to replace advice given to you by your health care provider. Make sure you discuss any questions you have with your health care provider.  Paranoia Paranoia is a distrust of others that is not based on a real reason for distrust. This may reach delusional levels. This means the paranoid person feels the world is against them when there is no reason to make them feel that way. People with paranoia feel as though people around them are "out to get them".  SIMILAR MENTAL ILNESSES  Depression is a feeling as though you are down all the time. It is normal in some situations where you have just lost a loved one. It is abnormal if you are having feelings of paranoia with it.  Dementia is a physical problem with the brain in which the brain no longer works properly. There are problems with daily activities of living. Alzheimer's disease is one example of this. Dementia is also caused by old age changes in the brain which come with the death of brain cells and small strokes.  Paranoidschizophrenia. People with paranoid schizophrenia and  persecutory delusional disorder have delusions in which they feel people around them are plotting against them. Persecutory delusions in paranoid schizophrenia are bizarre, sometimes grandiose, and often accompanied by auditory hallucinations. This means the person is hearing voices that are not there.  Delusionaldisorder (persecutory type). Delusions experienced by individuals with delusional disorder are more believable than those experienced by paranoid schizophrenics; they are not bizarre, though still unjustified. Individuals with delusional disorder may seem offbeat or quirky rather than mentally ill, and therefore, may never seek treatment. All of these problems usually do not allow these people to interact socially in an acceptable manner. CAUSES The cause of paranoia is often not known. It is common in people with extended abuse of:  Cocaine.  Amphetamine.  Marijuana.  Alcohol. Sometimes there is an inherited tendency. It may be associated with stress or changes in brain chemistry. DIAGNOSIS  When paranoia is present, your caregiver may:  Refer you to a specialist.  Do a physical exam.  Perform other tests on you to make sure there are not other problems causing the paranoia including:  Physical problems.  Mental problems.  Chemical problems (other than drugs). Testing may be done to determine if there is a psychiatric disability present that can be treated with medicine. TREATMENT   Paranoia that is a symptom of a psychiatric problem should be treated by professionals.  Medicines are available which can help this disorder. Antipsychotic medicine may be prescribed by your caregiver.  Sometimes psychotherapy may be useful.  Conditions such as depression or drug abuse are treated individually. If the paranoia is caused by drug abuse, a treatment facility may be helpful. Depression may be  helped by antidepressants. PROGNOSIS   Paranoid people are difficult to treat  because of their belief that everyone is out to get them or harm them. Because of this mistrust, they often must be talked into entering treatment by a trusted family member or friend. They may not want to take medicine as they may see this as an attempt to poison them.  Gradual gains in the trust of a therapist or caregiver helps in a successful treatment plan.  Some people with PPD or persecutory delusional disorder function in society without treatment in limited fashion. Document Released: 07/28/2003 Document Revised: 10/17/2011 Document Reviewed: 04/01/2008 [facility] Patient Information 2015 [place], [place]. This information is not intended to replace advice given to you by your health care provider. Make sure you discuss any questions you have with your health care provider.  Confusion Confusion is the inability to think with your usual speed or clarity. Confusion may come on quickly or slowly over time. How quickly the confusion comes on depends on the cause. Confusion can be due to any number of causes. CAUSES   Concussion, head injury, or head trauma.  Seizures.  Stroke.  Fever.  Brain tumor.  Age related decreased brain function (dementia).  Heightened emotional states like rage or terror.  Mental illness in which the person loses the ability to determine what is real and what is not (hallucinations).  Infections such as a urinary tract infection (UTI).  Toxic effects from alcohol, drugs, or prescription medicines.  Dehydration and an imbalance of salts in the body (electrolytes).  Lack of sleep.  Low blood sugar (diabetes).  Low levels of oxygen from conditions such as chronic lung disorders.  Drug interactions or other medicine side effects.  Nutritional deficiencies, especially niacin, thiamine, vitamin C, or vitamin B.  Sudden drop in body temperature (hypothermia).  Change in routine, such as when traveling or hospitalized. SIGNS AND SYMPTOMS  People  often describe their thinking as cloudy or unclear when they are confused. Confusion can also include feeling disoriented. That means you are unaware of where or who you are. You may also not know what the date or time is. If confused, you may also have difficulty paying attention, remembering, and making decisions. Some people also act aggressively when they are confused.  DIAGNOSIS  The medical evaluation of confusion may include:  Blood and urine tests.  X-rays.  Brain and nervous system tests.  Analyzing your brain waves (electroencephalogram or EEG).  Magnetic resonance imaging (MRI) of your head.  Computed tomography (CT) scan of your head.  Mental status tests in which your health care provider may ask many questions. Some of these questions may seem silly or strange, but they are a very important test to help diagnose and treat confusion. TREATMENT  An admission to the hospital may not be needed, but a person with confusion should not be left alone. Stay with a family member or friend until the confusion clears. Avoid alcohol, pain relievers, or sedative drugs until you have fully recovered. Do not drive until directed by your health care provider. HOME CARE INSTRUCTIONS  What family and friends can do:  To find out if someone is confused, ask the person to state his or her name, age, and the date. If the person is unsure or answers incorrectly, he or she is confused.  Always introduce yourself, no matter how well the person knows you.  Often remind the person of his or her location.  Place a calendar and clock  near the confused person.  Help the person with his or her medicines. You may want to use a pill box, an alarm as a reminder, or give the person each dose as prescribed.  Talk about current events and plans for the day.  Try to keep the environment calm, quiet, and peaceful.  Make sure the person keeps follow-up visits with his or her health care  provider. PREVENTION  Ways to prevent confusion:  Avoid alcohol.  Eat a balanced diet.  Get enough sleep.  Take medicine only as directed by your health care provider.  Do not become isolated. Spend time with other people and make plans for your days.  Keep careful watch on your blood sugar levels if you are diabetic. SEEK IMMEDIATE MEDICAL CARE IF:   You develop severe headaches, repeated vomiting, seizures, blackouts, or slurred speech.  There is increasing confusion, weakness, numbness, restlessness, or personality changes.  You develop a loss of balance, have marked dizziness, feel uncoordinated, or fall.  You have delusions, hallucinations, or develop severe anxiety.  Your family members think you need to be rechecked. Document Released: 09/01/2004 Document Revised: 12/09/2013 Document Reviewed: 08/30/2013 [facility] Patient Information 2015 [place], [place]. This information is not intended to replace advice given to you by your health care provider. Make sure you discuss any questions you have with your health care provider.  Psychosis Psychosis refers to a severe lack of understanding with reality. During a psychotic episode, you are not able to think clearly. During a psychotic episode, your responses and emotions are inappropriate and do not coincide with what is actually happening. You often have false beliefs about what is happening or who you are (delusions), and you may see, hear, taste, smell, or feel things that are not present (hallucinations). Psychosis is usually a severe symptom of a very serious mental health (psychiatric) condition, but it can sometimes be the result of a medical condition. CAUSES   Psychiatric conditions, such as:  Schizophrenia.  Bipolar disorder.  Depression.  Personality disorders.  Alcohol or drug abuse.  Medical conditions, such as:  Brain injury.  Brain tumor.  Dementia.  Brain diseases, such as Alzheimer's, Parkinson's,  or Huntington's disease.  Neurological diseases, such as epilepsy.  Genetic disorders.  Metabolic disorders.  Infections that affect the brain.  Certain prescription drugs.  Stroke. SYMPTOMS   Unable to think or speak clearly or respond appropriately.  Disorganized thinking (thoughts jump from one thought to another).  Severe inappropriate behavior.  Delusions may include:  A strong belief that is odd, unrealistic, or false.  Feeling extremely fearful or suspicious (paranoid).  Believing you are someone else, have high importance, or have an altered identity.  Hallucinations. DIAGNOSIS   Mental health evaluation.  Physical exam.  Blood tests.  Computerized magnetic scan (MRI) or other brain scans. TREATMENT  Your caregiver will recommend a course of treatment that depends on the cause of the psychosis. Treatment may include:  Monitoring and supportive care in the hospital.  Taking medicines (antipsychotic medicine) to reduce symptoms and balance chemicals in the brain.  Taking medicines to manage underlying mental health conditions.  Therapy and other supportive programs outside of the hospital.  Treating an underlying medical condition. If the cause of the psychosis can be treated or corrected, the outlook is good. Without treatment, psychotic episodes can cause danger to yourself or others. Treatment may be short-term or lifelong. HOME CARE INSTRUCTIONS   Take all medicines as directed. This is important.  Use a  pillbox or write down your medicine schedule to make sure you are taking them.  Check with your caregiver before using over-the-counter medicines, herbs, or supplements.  Seek individual and family support through therapy and mental health education (psychoeducation) programs. These will help you manage symptoms and side effects of medicines, learn life skills, and maintain a healthy routine.  Maintain a healthy lifestyle.  Exercise  regularly.  Avoid alcohol and drugs.  Learn ways to reduce stress and cope with stress, such as yoga and meditation.  Talk about your feelings with family members or caregivers.  Make time for yourself to do things you enjoy.  Know the early warning signs of psychosis. Your caregiver will recommend steps to take when you notice symptoms such as:  Feeling anxious or preoccupied.  Having racing thoughts.  Changes in your interest in life and relationships.  Follow up with your caregivers for continued outpatient treatment as directed. SEEK MEDICAL CARE IF:   Medicines do not seem to be helping.  You hear voices telling you to do things.  You see, smell, or feel things that are not there.  You feel hopeless and overwhelmed.  You feel extremely fearful and suspicious that something will harm you.  You feel like you cannot leave your house.  You have trouble taking care of yourself.  You experience side effects of medicines, such as changes in sleep patterns, dizziness, weight gain, restlessness, movement changes, muscle spasms, or tremors. SEEK IMMEDIATE MEDICAL CARE IF:  Severe psychotic symptoms present a safety issue (such as an urge to hurt yourself or others). MAKE SURE YOU:   Understand these instructions.  Will watch your condition.  Will get help right away if you are not doing well or get worse. FOR MORE INFORMATION  National Institute of Mental Health: http://www.maynard.net/ Document Released: 01/12/2010 Document Revised: 10/17/2011 Document Reviewed: 01/12/2010 [facility] Patient Information 2015 [place], [place]. This information is not intended to replace advice given to you by your health care provider. Make sure you discuss any questions you have with your health care provider.

## 2014-09-08 ENCOUNTER — Emergency Department (HOSPITAL_COMMUNITY)
Admission: EM | Admit: 2014-09-08 | Discharge: 2014-09-08 | Discharge status: Against Medical Advice | Payer: Medicare Other | Attending: Emergency Medicine | Admitting: Emergency Medicine

## 2014-09-08 ENCOUNTER — Encounter (HOSPITAL_COMMUNITY): Payer: Self-pay | Admitting: Emergency Medicine

## 2014-09-08 ENCOUNTER — Emergency Department (HOSPITAL_COMMUNITY): Payer: Medicare Other

## 2014-09-08 ENCOUNTER — Emergency Department (HOSPITAL_COMMUNITY)
Admission: EM | Admit: 2014-09-08 | Discharge: 2014-09-10 | Disposition: A | Discharge status: Home / Self Care | Payer: Medicare Other | Attending: Emergency Medicine | Admitting: Emergency Medicine

## 2014-09-08 ENCOUNTER — Encounter (HOSPITAL_COMMUNITY): Payer: Self-pay | Admitting: Adult Health

## 2014-09-08 DIAGNOSIS — F319 Bipolar disorder, unspecified: Secondary | ICD-10-CM | POA: Insufficient documentation

## 2014-09-08 DIAGNOSIS — F250 Schizoaffective disorder, bipolar type: Secondary | ICD-10-CM

## 2014-09-08 DIAGNOSIS — R2243 Localized swelling, mass and lump, lower limb, bilateral: Secondary | ICD-10-CM | POA: Insufficient documentation

## 2014-09-08 DIAGNOSIS — F259 Schizoaffective disorder, unspecified: Secondary | ICD-10-CM | POA: Diagnosis present

## 2014-09-08 DIAGNOSIS — F209 Schizophrenia, unspecified: Secondary | ICD-10-CM | POA: Insufficient documentation

## 2014-09-08 DIAGNOSIS — Z590 Homelessness: Secondary | ICD-10-CM | POA: Insufficient documentation

## 2014-09-08 DIAGNOSIS — R609 Edema, unspecified: Secondary | ICD-10-CM

## 2014-09-08 NOTE — ED Notes (Addendum)
Pt from jail (was released) via [facility] c/o bilateral ankle swelling and productive cough. Pt disruptive upon arrival  security called to bedside. Pt refuses to answer questions. When asked about what we can help him with he states "[facility]". Pt states his pain is 6/10 but will  Not tell me where. He refuses to take deep breaths for lung assessment.

## 2014-09-08 NOTE — ED Notes (Addendum)
PT refused blood pressure cuff and all monitors despite asking him multiple times and explaining what they were for. PT states he just wants his feet looked at.

## 2014-09-08 NOTE — ED Notes (Signed)
Pt provided with paper scrubs because he is in a gown.

## 2014-09-08 NOTE — ED Provider Notes (Signed)
CSN: [id number]     Arrival date & time 09/08/14  2242 History  This chart was scribed for Rolland PorterMark Elvis Laufer, MD by Evon Slackerrance Branch, ED Scribe. This patient was seen in room A05C/A05C and the patient's care was started at 11:54 PM.      Chief Complaint  Patient presents with  . Medical Clearance    psychotic   The history is provided by the patient. No language interpreter was used.   HPI Comments: Gordon Martin is a 49 y.o. male who presents to the Emergency Department complaining of bilateral foot and leg swelling. Pt states that his the swelling is worse when eating green beans or spinach.  Pt states that he has fractured ribs on the right side 2 weeks prior. Pt states that he need to be admitted to the psych ward. Pt states that he has a Hx of bipolar disorder that he use to take lithium for. Pt states that he hasn't taken lithium for several years.  Pt states that he just got out of jail this morning.    [facility].  Removed for theatening behavior.    Past Medical History  Diagnosis Date  . Bipolar disorder   . Gout    History reviewed. No pertinent past surgical history. History reviewed. No pertinent family history. History  Substance Use Topics  . Smoking status: Never Smoker   . Smokeless tobacco: Not on file  . Alcohol Use: No    Review of Systems  Constitutional: Negative for fever, chills, diaphoresis, appetite change and fatigue.  HENT: Negative for mouth sores, sore throat and trouble swallowing.   Eyes: Negative for visual disturbance.  Respiratory: Negative for cough, chest tightness, shortness of breath and wheezing.   Cardiovascular: Positive for leg swelling. Negative for chest pain.  Gastrointestinal: Negative for nausea, vomiting, abdominal pain, diarrhea and abdominal distention.  Endocrine: Negative for polydipsia, polyphagia and polyuria.  Genitourinary: Negative for dysuria, frequency and hematuria.  Musculoskeletal: Negative for gait problem.   Skin: Negative for color change, pallor and rash.  Neurological: Negative for dizziness, syncope, light-headedness and headaches.  Hematological: Does not bruise/bleed easily.  Psychiatric/Behavioral: Negative for behavioral problems and confusion.      Allergies  Bee venom; Haloperidol and related; Risperidone and related; and Zyprexa relprevv  Home Medications   Prior to Admission medications   Medication Sig Start Date End Date Taking? Authorizing Provider  divalproex (DEPAKOTE) 500 MG DR tablet Take 1 tablet (500 mg total) by mouth 2 (two) times daily before a meal. Patient not taking: Reported on 09/09/2014 06/03/14   Shuvon Rankin, NP  furosemide (LASIX) 40 MG tablet Take 1 tablet (40 mg total) by mouth daily. Patient not taking: Reported on 09/09/2014 05/31/14   Tatyana A Kirichenko, PA-C  ibuprofen (ADVIL,MOTRIN) 800 MG tablet Take 800 mg by mouth every 8 (eight) hours as needed for moderate pain.    Historical Provider, MD  QUEtiapine (SEROQUEL) 200 MG tablet Take 1 tablet (200 mg total) by mouth at bedtime. Patient not taking: Reported on 09/09/2014 06/03/14   Shuvon Rankin, NP  traZODone (DESYREL) 100 MG tablet Take 100 mg by mouth at bedtime.    Historical Provider, MD   BP 127/90 mmHg  Pulse 71  Temp(Src) 97.9 F (36.6 C) (Oral)  Resp 18  SpO2 100%   Physical Exam  Constitutional: He is oriented to person, place, and time. He appears well-developed and well-nourished. No distress.  HENT:  Head: Normocephalic.  Eyes: Conjunctivae are  normal. Pupils are equal, round, and reactive to light. No scleral icterus.  Neck: Normal range of motion. Neck supple. No thyromegaly present.  Cardiovascular: Normal rate and regular rhythm.  Exam reveals no gallop and no friction rub.   No murmur heard. Pulmonary/Chest: Effort normal and breath sounds normal. No respiratory distress. He has no wheezes. He has no rales.  Abdominal: Soft. Bowel sounds are normal. He exhibits no  distension. There is no tenderness. There is no rebound.  Musculoskeletal: Normal range of motion.  symmetric swelling of bilateral lower extremities eythema but no warmth.   Neurological: He is alert and oriented to person, place, and time.  Skin: Skin is warm and dry. No rash noted.  Psychiatric:  Disorganized thoughts, flight of ideas.     ED Course  Procedures (including critical care time) DIAGNOSTIC STUDIES: Oxygen Saturation is 97% on RA, normal by my interpretation.    COORDINATION OF CARE:  Labs Review Labs Reviewed  CBC WITH DIFFERENTIAL/PLATELET - Abnormal; Notable for the following:    RBC 3.81 (*)    Hemoglobin 11.4 (*)    HCT 33.6 (*)    All other components within normal limits  COMPREHENSIVE METABOLIC PANEL - Abnormal; Notable for the following:    Glucose, Bld 123 (*)    All other components within normal limits  ACETAMINOPHEN LEVEL - Abnormal; Notable for the following:    Acetaminophen (Tylenol), Serum <10.0 (*)    All other components within normal limits  URINALYSIS, ROUTINE W REFLEX MICROSCOPIC  ETHANOL  SALICYLATE LEVEL  URINE RAPID DRUG SCREEN (HOSP PERFORMED)    Imaging Review Dg Chest 2 View  09/08/2014   CLINICAL DATA:  Initial evaluation for bilateral foot swelling.  EXAM: CHEST  2 VIEW  COMPARISON:  Prior radiograph from 05/30/2014  FINDINGS: The cardiac and mediastinal silhouettes are stable in size and contour, and remain within normal limits.  The lungs are normally inflated. No airspace consolidation, pleural effusion, or pulmonary edema is identified. There is no pneumothorax.  No acute osseous abnormality identified.  IMPRESSION: No active cardiopulmonary disease.   Electronically Signed   By: Rise Mu M.D.   On: 09/08/2014 23:46     EKG Interpretation None      MDM   Final diagnoses:  Edema  Bipolar affective disorder, manic, moderate    Pt manic.  Dependent edema, without concern for ARF, CHF, Cellulitis, or DVT.  H/O  edema and prior diuretic use.   Medically clear for Psych eval.    Will need instruction for dependent edema (elevation, etc.) and Rx Furosemide.  (Will avoid HCTZ as pt states h/o gout)   I personally performed the services described in this documentation, which was scribed in my presence. The recorded information has been reviewed and is accurate.     Rolland Porter, MD 09/10/14 [phone]
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
## 2014-09-09 DIAGNOSIS — F22 Delusional disorders: Secondary | ICD-10-CM

## 2014-09-09 LAB — CBC WITH DIFFERENTIAL/PLATELET
Basophils Absolute: 0 10*3/uL (ref 0.0–0.1)
Basophils Relative: 0 % (ref 0–1)
Eosinophils Absolute: 0.1 10*3/uL (ref 0.0–0.7)
Eosinophils Relative: 2 % (ref 0–5)
HCT: 33.6 % — ABNORMAL LOW (ref 39.0–52.0)
HEMOGLOBIN: 11.4 g/dL — AB (ref 13.0–17.0)
Lymphocytes Relative: 25 % (ref 12–46)
Lymphs Abs: 1.2 10*3/uL (ref 0.7–4.0)
MCH: 29.9 pg (ref 26.0–34.0)
MCHC: 33.9 g/dL (ref 30.0–36.0)
MCV: 88.2 fL (ref 78.0–100.0)
MONO ABS: 0.6 10*3/uL (ref 0.1–1.0)
Monocytes Relative: 12 % (ref 3–12)
NEUTROS PCT: 61 % (ref 43–77)
Neutro Abs: 2.9 10*3/uL (ref 1.7–7.7)
Platelets: 222 10*3/uL (ref 150–400)
RBC: 3.81 MIL/uL — ABNORMAL LOW (ref 4.22–5.81)
RDW: 13.7 % (ref 11.5–15.5)
WBC: 4.8 10*3/uL (ref 4.0–10.5)

## 2014-09-09 LAB — RAPID URINE DRUG SCREEN, HOSP PERFORMED
AMPHETAMINES: NOT DETECTED
Barbiturates: NOT DETECTED
Benzodiazepines: NOT DETECTED
COCAINE: NOT DETECTED
Opiates: NOT DETECTED
Tetrahydrocannabinol: NOT DETECTED

## 2014-09-09 LAB — COMPREHENSIVE METABOLIC PANEL
ALT: 10 U/L (ref 0–53)
ANION GAP: 10 (ref 5–15)
AST: 16 U/L (ref 0–37)
Albumin: 3.5 g/dL (ref 3.5–5.2)
Alkaline Phosphatase: 75 U/L (ref 39–117)
BUN: 17 mg/dL (ref 6–23)
CHLORIDE: 105 mmol/L (ref 96–112)
CO2: 24 mmol/L (ref 19–32)
Calcium: 9.2 mg/dL (ref 8.4–10.5)
Creatinine, Ser: 0.93 mg/dL (ref 0.50–1.35)
GFR calc Af Amer: >90 mL/min (ref >90)
GFR calc non Af Amer: >90 mL/min (ref >90)
Glucose, Bld: 123 mg/dL — ABNORMAL HIGH (ref 70–99)
Potassium: 3.5 mmol/L (ref 3.5–5.1)
Sodium: 139 mmol/L (ref 135–145)
Total Bilirubin: 0.7 mg/dL (ref 0.3–1.2)
Total Protein: 6.1 g/dL (ref 6.0–8.3)

## 2014-09-09 LAB — URINALYSIS, ROUTINE W REFLEX MICROSCOPIC
Bilirubin Urine: NEGATIVE
GLUCOSE, UA: NEGATIVE mg/dL
HGB URINE DIPSTICK: NEGATIVE
Ketones, ur: NEGATIVE mg/dL
Leukocytes, UA: NEGATIVE
Nitrite: NEGATIVE
PH: 7 (ref 5.0–8.0)
Protein, ur: NEGATIVE mg/dL
Specific Gravity, Urine: 1.006 (ref 1.005–1.030)
Urobilinogen, UA: 0.2 mg/dL (ref 0.0–1.0)

## 2014-09-09 LAB — ACETAMINOPHEN LEVEL

## 2014-09-09 LAB — SALICYLATE LEVEL: Salicylate Lvl: <4 mg/dL (ref 2.8–20.0)

## 2014-09-09 LAB — ETHANOL

## 2014-09-09 MED ORDER — DIVALPROEX SODIUM 500 MG PO DR TAB
500.0000 mg | DELAYED_RELEASE_TABLET | Freq: Two times a day (BID) | ORAL | Status: DC
  Filled 2014-09-09 (×2): qty 1
  Filled 2014-09-09: qty 2

## 2014-09-09 MED ORDER — FUROSEMIDE 40 MG PO TABS
40.0000 mg | ORAL_TABLET | Freq: Every day | ORAL | Status: DC
  Administered 2014-09-09 – 2014-09-10 (×2): 40 mg via ORAL
  Filled 2014-09-09: qty 1
  Filled 2014-09-09: qty 2

## 2014-09-09 MED ORDER — QUETIAPINE FUMARATE 100 MG PO TABS
200.0000 mg | ORAL_TABLET | Freq: Every day | ORAL | Status: DC
  Filled 2014-09-09: qty 2

## 2014-09-09 MED ORDER — IBUPROFEN 200 MG PO TABS
600.0000 mg | ORAL_TABLET | Freq: Three times a day (TID) | ORAL | Status: DC | PRN
  Administered 2014-09-09: 600 mg via ORAL
  Filled 2014-09-09 (×2): qty 1

## 2014-09-09 MED ORDER — IBUPROFEN 800 MG PO TABS
800.0000 mg | ORAL_TABLET | Freq: Once | ORAL | Status: AC
Start: 2014-09-09 — End: 2014-09-09
  Administered 2014-09-09: 800 mg via ORAL
  Filled 2014-09-09: qty 1

## 2014-09-09 MED ORDER — LORAZEPAM 1 MG PO TABS
1.0000 mg | ORAL_TABLET | ORAL | Status: DC | PRN
  Administered 2014-09-09 (×2): 1 mg via ORAL
  Filled 2014-09-09 (×2): qty 1

## 2014-09-09 MED ORDER — TRAZODONE HCL 100 MG PO TABS
100.0000 mg | ORAL_TABLET | Freq: Every day | ORAL | Status: DC
  Filled 2014-09-09: qty 1

## 2014-09-09 MED ORDER — ZIPRASIDONE MESYLATE 20 MG IM SOLR: 20.0000 mg | Freq: Four times a day (QID) | INTRAMUSCULAR | Status: DC | PRN

## 2014-09-09 NOTE — ED Provider Notes (Signed)
Pt manic, endorsing SI to staff, per psych recs [facility].   1. Edema   2. Bipolar affective disorder, manic, moderate      Toy CookeyMegan Docherty, MD 09/09/14 1800

## 2014-09-09 NOTE — ED Notes (Signed)
Security called to room

## 2014-09-09 NOTE — ED Notes (Signed)
Staffing notified of need of a sitter.

## 2014-09-09 NOTE — ED Notes (Signed)
Minerva AreolaEric, RN, Consulting civil engineerCharge RN, and Dr Micheline Mazeocherty aware [facility].
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
## 2014-09-09 NOTE — ED Notes (Signed)
Pt is a 49 year old voluntary admit from [facility]. Pt presents stating, I am the president and I will hire you to be my commander and chief and pay you 13 million dollars." Pt is very manic on and off the phone. He presents with a positive cough and can be intrusive with the other pts. He denies Si and HI. Pt was served a dinner tray and taken to his room. He has been pacing in hall.

## 2014-09-09 NOTE — ED Notes (Signed)
Called staffing to report IVC. Dr. Fayrene FearingJames completing IVC forms.  Also reported patient's request for motrin for pain management. MD acknowledges.

## 2014-09-09 NOTE — ED Notes (Signed)
Per Thayer Ohmhris, patient is declined [facility] due to acuity.

## 2014-09-09 NOTE — ED Notes (Signed)
telepsych being conducted at the bedside.  
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
## 2014-09-09 NOTE — ED Notes (Signed)
Per Corrie DandyMary at [facility], pt meets inpatient criteria, and they are looking for a bed [facility] for him. If no bed at [facility], then placement will be sought elsewhere. Mary instructs that the pt should be IVC. Fayrene FearingJames, MD notified.

## 2014-09-09 NOTE — ED Notes (Signed)
Dr. James at the bedside.  
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
## 2014-09-09 NOTE — Progress Notes (Signed)
Pt referral also faxed to the following facilities:   [facility]  Will continue to seek placement.  Chad CordialLauren Carter, LCSWA 09/09/2014 2:50 PM

## 2014-09-09 NOTE — Progress Notes (Signed)
CSW left message for Transfer staff at [facility] in attempts to check on bed availability and get correct fax information at approximately 8:30am. Still have not received call back. Will fax [facility] to [facility].  Pt [facility] also [facility] to [facility], who per [place], have bed availability.   Will continue to seek placement.  Chad CordialLauren Carter, LCSWA 09/09/2014 2:32 PM

## 2014-09-09 NOTE — ED Notes (Signed)
[facility] HAS ARRIVED TO TRANSPORT PT.

## 2014-09-09 NOTE — ED Notes (Signed)
Pt sleeping in bed. Respirations at 16 per minute, unlabored.

## 2014-09-09 NOTE — ED Notes (Signed)
telepsych still being conducted.

## 2014-09-09 NOTE — ED Notes (Addendum)
PT'S NEIGHBOR Jillyn Hidden- GARY FUTRELL - [phone] - STATES PT LIVES [place] [address] FROM HIM - [address] - CORRECTED W/REGISTRATION.   PT'S BROTHER - TY - [phone], PER GARY.
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
## 2014-09-10 ENCOUNTER — Encounter (HOSPITAL_COMMUNITY): Payer: Self-pay | Admitting: Emergency Medicine

## 2014-09-10 ENCOUNTER — Emergency Department (HOSPITAL_COMMUNITY)
Admission: EM | Admit: 2014-09-10 | Discharge: 2014-09-10 | Discharge status: Against Medical Advice | Payer: Medicare Other | Attending: Emergency Medicine | Admitting: Emergency Medicine

## 2014-09-10 DIAGNOSIS — Z590 Homelessness: Secondary | ICD-10-CM | POA: Insufficient documentation

## 2014-09-10 DIAGNOSIS — F250 Schizoaffective disorder, bipolar type: Secondary | ICD-10-CM | POA: Insufficient documentation

## 2014-09-10 DIAGNOSIS — F259 Schizoaffective disorder, unspecified: Secondary | ICD-10-CM

## 2014-09-10 NOTE — ED Notes (Signed)
Pt states he just wants a meal. Sandwich, coke, peanut butter given along with bus pass pt discharged. PA aware.
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
## 2014-09-10 NOTE — BHH Counselor (Signed)
TTS Counselor contacted the following facilities in effort to place pt:  Beds available now or possibly in the morning, [facility]    [facility]

## 2014-09-10 NOTE — ED Notes (Addendum)
Pt reports he cannot find the keys to his home. Pt states he is not SI/HI.

## 2014-09-10 NOTE — ED Notes (Signed)
Pt is grandiose and delusional in thinking saying that he is the "president", "Karin GoldenHarris Teeter is broke" and "no more money going to stupid jew people" Pt refused morning depakote and reported to MD. Pt denies si and hi. Safety maintained.

## 2014-09-10 NOTE — Progress Notes (Signed)
Pt IVC rescinded.  Byrd HesselbachKristen Mardell Cragg, LCSW [phone]  ED CSW 09/10/2014 11:15 AM

## 2014-09-10 NOTE — BHH Suicide Risk Assessment (Signed)
Suicide Risk Assessment  Discharge Assessment   [facility]   Demographic Factors:  Male, Caucasian and Low socioeconomic status  Total Time spent with patient: 45 minutes  Musculoskeletal: Strength & Muscle Tone: within normal limits Gait & Station: normal Patient leans: N/A  Psychiatric Specialty Exam:     Blood pressure 118/61, pulse 76, temperature 97.8 F (36.6 C), temperature source Oral, resp. rate 20, SpO2 100 %.There is no weight on file to calculate BMI.  General Appearance: Casual  Eye Contact::  Good  Speech:  Normal Rate  Volume:  Normal  Mood:  Euthymic  Affect:  Congruent  Thought Process:  Coherent  Orientation:  Full (Time, Place, and Person)  Thought Content:  WDL  Suicidal Thoughts:  No  Homicidal Thoughts:  No  Memory:  Immediate;   Good Recent;   Good Remote;   Good  Judgement:  Fair  Insight:  Fair  Psychomotor Activity:  Normal  Concentration:  Good  Recall:  Good  Fund of Knowledge:Good  Language: Good  Akathisia:  No  Handed:  Right  AIMS (if indicated):     Assets:  Leisure Time Physical Health Resilience  ADL's:  Intact  Cognition: WNL  Sleep:         Has this patient used any form of tobacco in the last 30 days? (Cigarettes, Smokeless Tobacco, Cigars, and/or Pipes) Yes, A prescription for an FDA-approved tobacco cessation medication was offered at discharge and the patient refused  Mental Status Per Nursing Assessment::   On Admission:   Requested admission for his bipolar disorder  Current Mental Status by Physician: NA  Loss Factors: NA  Historical Factors: NA  Risk Reduction Factors:   Positive coping skills or problem solving skills  Continued Clinical Symptoms:  None  Cognitive Features That Contribute To Risk:  None    Suicide Risk:  Minimal: No identifiable suicidal ideation.  Patients presenting with no risk factors but with morbid ruminations; may be classified as minimal risk  based on the severity of the depressive symptoms  Principal Problem: Schizoaffective disorder Discharge Diagnoses:  Patient Active Problem List   Diagnosis Date Noted  . Schizoaffective disorder [F25.9] 09/10/2014    Priority: High  . Schizoaffective disorder, bipolar type [F25.0]       Plan Of Care/Follow-up recommendations:  Activity:  as tolerated Diet:  heart healthy diet  Is patient on multiple antipsychotic therapies at discharge:  No   Has Patient had three or more failed trials of antipsychotic monotherapy by history:  No  Recommended Plan for Multiple Antipsychotic Therapies: NA    Roc Streett, PMH-NP 09/10/2014, 11:03 AM

## 2014-09-10 NOTE — Progress Notes (Signed)
CSW met with pt at bedside. Per psychiatrist, patient psychiatrically stable for discharge home. CSW offered pt outpatient resources. CSW and pt discussed [facility], pt stated, "I dont want to go there." patient inquired about the heat bus to go to [place]. CSW explain that the heat bus is only be for local schools. Patient stated CSW was wrong. CSW directed patient to follow up with heat. Pt accepted outpatient resource sheet and bus pass.   Noreene Larsson [phone]  ED CSW 09/10/2014 10:00 AM

## 2014-09-11 ENCOUNTER — Encounter (HOSPITAL_COMMUNITY): Payer: Self-pay | Admitting: Emergency Medicine

## 2014-09-11 ENCOUNTER — Emergency Department (HOSPITAL_COMMUNITY)
Admission: EM | Admit: 2014-09-11 | Discharge: 2014-09-11 | Disposition: A | Discharge status: Home / Self Care | Payer: Medicare Other | Attending: Emergency Medicine | Admitting: Emergency Medicine

## 2014-09-11 DIAGNOSIS — R600 Localized edema: Secondary | ICD-10-CM | POA: Insufficient documentation

## 2014-09-11 DIAGNOSIS — M79662 Pain in left lower leg: Secondary | ICD-10-CM | POA: Insufficient documentation

## 2014-09-11 DIAGNOSIS — Z79899 Other long term (current) drug therapy: Secondary | ICD-10-CM | POA: Insufficient documentation

## 2014-09-11 DIAGNOSIS — R609 Edema, unspecified: Secondary | ICD-10-CM

## 2014-09-11 DIAGNOSIS — M109 Gout, unspecified: Secondary | ICD-10-CM | POA: Insufficient documentation

## 2014-09-11 DIAGNOSIS — M79661 Pain in right lower leg: Secondary | ICD-10-CM | POA: Insufficient documentation

## 2014-09-11 DIAGNOSIS — F319 Bipolar disorder, unspecified: Secondary | ICD-10-CM | POA: Insufficient documentation

## 2014-09-11 DIAGNOSIS — Z008 Encounter for other general examination: Secondary | ICD-10-CM

## 2014-09-11 LAB — ETHANOL: Alcohol, Ethyl (B): <5 mg/dL (ref 0–9)

## 2014-09-11 LAB — COMPREHENSIVE METABOLIC PANEL
ALBUMIN: 4.1 g/dL (ref 3.5–5.2)
ALT: 11 U/L (ref 0–53)
AST: 17 U/L (ref 0–37)
Alkaline Phosphatase: 84 U/L (ref 39–117)
Anion gap: 6 (ref 5–15)
BILIRUBIN TOTAL: 0.5 mg/dL (ref 0.3–1.2)
BUN: 20 mg/dL (ref 6–23)
CALCIUM: 9.3 mg/dL (ref 8.4–10.5)
CO2: 29 mmol/L (ref 19–32)
CREATININE: 0.76 mg/dL (ref 0.50–1.35)
Chloride: 110 mmol/L (ref 96–112)
GLUCOSE: 103 mg/dL — AB (ref 70–99)
Potassium: 4.2 mmol/L (ref 3.5–5.1)
SODIUM: 145 mmol/L (ref 135–145)
Total Protein: 6.9 g/dL (ref 6.0–8.3)

## 2014-09-11 LAB — RAPID URINE DRUG SCREEN, HOSP PERFORMED
AMPHETAMINES: NOT DETECTED
BENZODIAZEPINES: NOT DETECTED
Barbiturates: NOT DETECTED
COCAINE: NOT DETECTED
OPIATES: NOT DETECTED
Tetrahydrocannabinol: NOT DETECTED

## 2014-09-11 LAB — SALICYLATE LEVEL: Salicylate Lvl: <4 mg/dL (ref 2.8–20.0)

## 2014-09-11 LAB — CBC
HCT: 37.1 % — ABNORMAL LOW (ref 39.0–52.0)
Hemoglobin: 12.1 g/dL — ABNORMAL LOW (ref 13.0–17.0)
MCH: 29.9 pg (ref 26.0–34.0)
MCHC: 32.6 g/dL (ref 30.0–36.0)
MCV: 91.6 fL (ref 78.0–100.0)
PLATELETS: 262 10*3/uL (ref 150–400)
RBC: 4.05 MIL/uL — AB (ref 4.22–5.81)
RDW: 14 % (ref 11.5–15.5)
WBC: 4.7 10*3/uL (ref 4.0–10.5)

## 2014-09-11 LAB — ACETAMINOPHEN LEVEL: Acetaminophen (Tylenol), Serum: <10 ug/mL — ABNORMAL LOW (ref 10–30)

## 2014-09-11 MED ORDER — FUROSEMIDE 40 MG PO TABS: 40.0000 mg | ORAL_TABLET | Freq: Every day | ORAL | Status: DC

## 2014-09-11 NOTE — ED Provider Notes (Signed)
CSN: [id number]     Arrival date & time 09/11/14  0104 History   First MD Initiated Contact with Patient 09/11/14 0138     Chief Complaint  Patient presents with  . Medical Clearance     (Consider location/radiation/quality/duration/timing/severity/associated sxs/prior Treatment) HPI Comments: Patient is a 49 year old male with history of bipolar disorder and gout. He was released from the emergency department at 2000 yesterday. He returns to the emergency department escorted by [employer]. [employer] states that they brought the patient to jail for a warrant and was told by the nurse that he needed to be medically cleared for his bilateral lower extremity edema. Per prior notes, patient has been noted to have dependent edema. He was supposed to be discharged with a course of Lasix. It does not appear that this was given to the patient after discharged by psychiatry. Patient has no additional complaints at this time. He denies any worsening of his edema since he was last seen. He does report being on Lasix in the past; he believes it was when he was last in jail. He is requesting to Cokes and a Sprite. He states that he is also hungry.  The history is provided by the patient and the police. No language interpreter was used.    Past Medical History  Diagnosis Date  . Bipolar disorder   . Gout    History reviewed. No pertinent past surgical history. History reviewed. No pertinent family history. History  Substance Use Topics  . Smoking status: Never Smoker   . Smokeless tobacco: Not on file  . Alcohol Use: No    Review of Systems  Cardiovascular: Positive for leg swelling.  All other systems reviewed and are negative.   Allergies  Bee venom; Haloperidol and related; Risperidone and related; and Zyprexa relprevv  Home Medications   Prior to Admission medications   Medication Sig Start Date End Date Taking? Authorizing Provider  divalproex (DEPAKOTE) 500 MG DR tablet Take 1 tablet (500 mg  total) by mouth 2 (two) times daily before a meal. Patient not taking: Reported on 09/09/2014 06/03/14   Shuvon Rankin, NP  furosemide (LASIX) 40 MG tablet Take 1 tablet (40 mg total) by mouth daily. Patient not taking: Reported on 09/09/2014 05/31/14   Tatyana A Kirichenko, PA-C  ibuprofen (ADVIL,MOTRIN) 800 MG tablet Take 800 mg by mouth every 8 (eight) hours as needed for moderate pain.    Historical Provider, MD  QUEtiapine (SEROQUEL) 200 MG tablet Take 1 tablet (200 mg total) by mouth at bedtime. Patient not taking: Reported on 09/09/2014 06/03/14   Shuvon Rankin, NP  traZODone (DESYREL) 100 MG tablet Take 100 mg by mouth at bedtime.    Historical Provider, MD   BP 135/81 mmHg  Pulse 80  Temp(Src) 98.6 F (37 C) (Oral)  Resp 16  SpO2 100%   Physical Exam  Constitutional: He is oriented to person, place, and time. He appears well-developed and well-nourished. No distress.  Nontoxic/nonseptic appearing  HENT:  Head: Normocephalic and atraumatic.  Eyes: Conjunctivae and EOM are normal. No scleral icterus.  Neck: Normal range of motion.  Cardiovascular: Normal rate, regular rhythm and intact distal pulses.   DP and PT pulses faint, but palpable; limited secondary to pitting edema.  Pulmonary/Chest: Effort normal. No respiratory distress.  Respirations even and unlabored  Musculoskeletal: Normal range of motion. He exhibits tenderness.  Pitting edema 3+ in b/l lower extremities up to proximal calf. Mild associated tenderness with deep palpation only.  Neurological: He  is alert and oriented to person, place, and time. He exhibits normal muscle tone. Coordination normal.  Sensation to light touch intact. Patient able to wiggle all toes bilaterally. He is ambulatory with steady gait.  Skin: Skin is warm and dry. No rash noted. He is not diaphoretic. No erythema. No pallor.  Psychiatric: He has a normal mood and affect. His behavior is normal.  Nursing note and vitals reviewed.   ED Course   Procedures (including critical care time) Labs Review Labs Reviewed  ACETAMINOPHEN LEVEL - Abnormal; Notable for the following:    Acetaminophen (Tylenol), Serum <10.0 (*)    All other components within normal limits  CBC - Abnormal; Notable for the following:    RBC 4.05 (*)    Hemoglobin 12.1 (*)    HCT 37.1 (*)    All other components within normal limits  COMPREHENSIVE METABOLIC PANEL - Abnormal; Notable for the following:    Glucose, Bld 103 (*)    All other components within normal limits  ETHANOL  SALICYLATE LEVEL  URINE RAPID DRUG SCREEN (HOSP PERFORMED)    Imaging Review No results found.   EKG Interpretation None      MDM   Final diagnoses:  Dependent edema  Medical clearance for incarceration    49 year old male presents to the emergency department for hours after discharge following psychiatric evaluation. He is escorted by [employer] who is requesting medical clearance for incarceration because of his lower extremity edema. Patient's lower extremity edema was noted during his prior visit. His laboratory workup today is consistent with his baseline. He has no tachycardia, tachypnea, dyspnea, or hypoxia to suggest increased vascular congestion as a result of his lower extremity edema. He is neurovascularly intact on my exam. No evidence of secondary cellulitis.  Patient was not provided a prescription for Lasix at his prior discharge. Patient will be started on 40 mg Lasix daily for management of his dependent edema. No indication for further emergent workup at this time. Patient is appropriate for discharge to jail in the custody of [employer].   Filed Vitals:   09/11/14 0115  BP: 135/81  Pulse: 80  Temp: 98.6 F (37 C)  TempSrc: Oral  Resp: 16  SpO2: 100%       Antony Madura, PA-C 09/12/14 1610  Ward Givens, MD 09/12/14 [phone]

## 2014-09-11 NOTE — Discharge Instructions (Signed)

## 2014-09-11 NOTE — ED Notes (Addendum)
Pt escorted by [employer] needs to be medically cleared due to bilateral lower extremity swelling before he can be accepted to jail.

## 2015-11-15 IMAGING — CR DG CHEST 2V
2 series · 2 of 2 positions shown · non-contrast
Comparison: None.

CLINICAL DATA: Bilateral lower extremity swelling.  Cough.

EXAM:
CHEST  2 VIEW

[w chest pa]
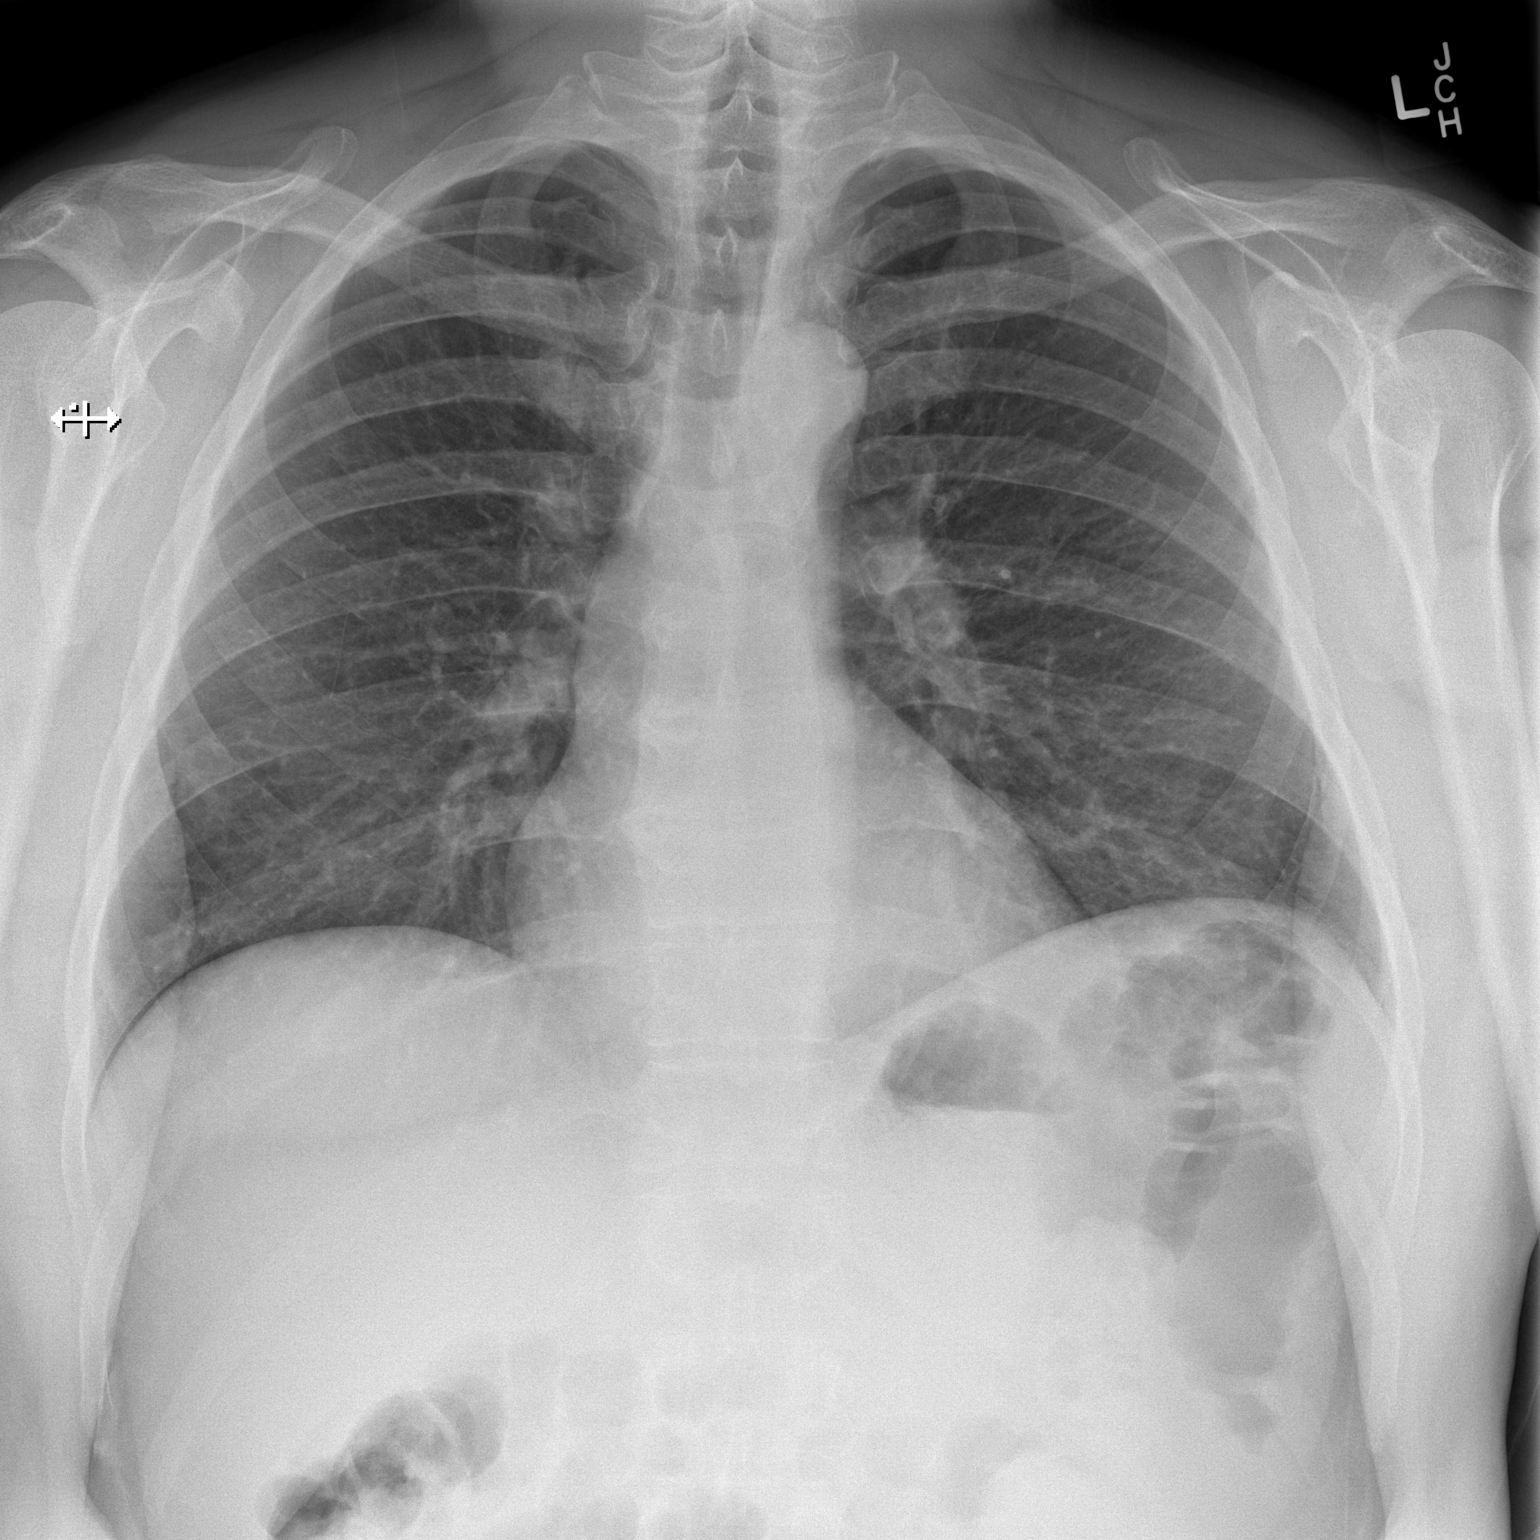

[w chest lat]
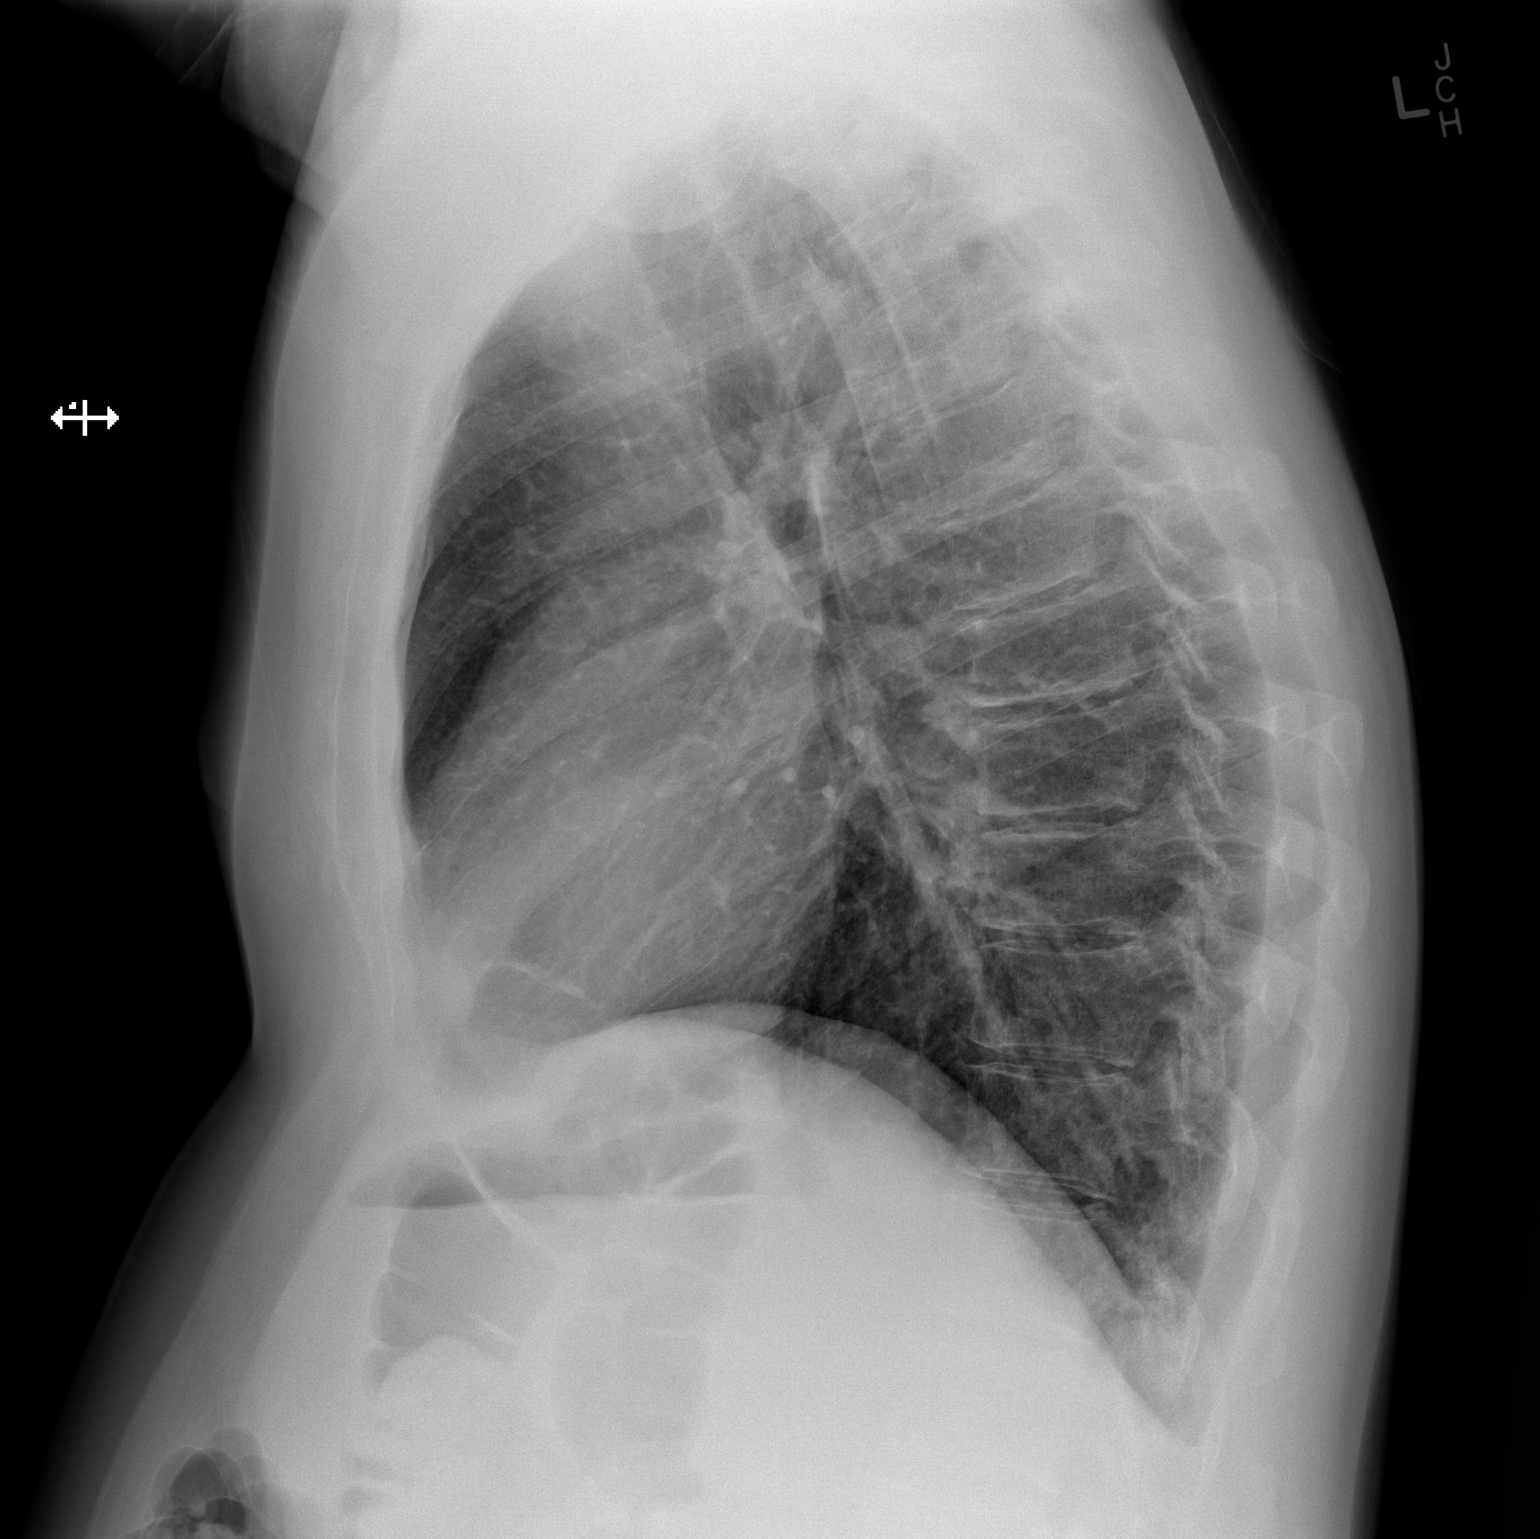

[2 of 2 positions shown; findings below may reference images not displayed]

FINDINGS: The heart size and mediastinal contours are within normal limits.
Both lungs are clear. The visualized skeletal structures are
unremarkable.
IMPRESSION: No active cardiopulmonary disease.

## 2020-11-11 ENCOUNTER — Emergency Department (HOSPITAL_COMMUNITY)
Admission: EM | Admit: 2020-11-11 | Discharge: 2020-11-12 | Disposition: A | Discharge status: Other Acute Inpatient Hospital | Payer: Medicare PPO | Attending: Emergency Medicine | Admitting: Emergency Medicine

## 2020-11-11 ENCOUNTER — Other Ambulatory Visit: Payer: Self-pay

## 2020-11-11 DIAGNOSIS — F259 Schizoaffective disorder, unspecified: Secondary | ICD-10-CM | POA: Diagnosis present

## 2020-11-11 DIAGNOSIS — F22 Delusional disorders: Secondary | ICD-10-CM | POA: Insufficient documentation

## 2020-11-11 DIAGNOSIS — Z20822 Contact with and (suspected) exposure to covid-19: Secondary | ICD-10-CM | POA: Insufficient documentation

## 2020-11-11 DIAGNOSIS — Z046 Encounter for general psychiatric examination, requested by authority: Secondary | ICD-10-CM | POA: Diagnosis present

## 2020-11-11 DIAGNOSIS — Z79899 Other long term (current) drug therapy: Secondary | ICD-10-CM | POA: Insufficient documentation

## 2020-11-11 LAB — CBC WITH DIFFERENTIAL/PLATELET
Abs Immature Granulocytes: 0.01 10*3/uL (ref 0.00–0.07)
Basophils Absolute: 0.1 10*3/uL (ref 0.0–0.1)
Basophils Relative: 1 %
Eosinophils Absolute: 0.1 10*3/uL (ref 0.0–0.5)
Eosinophils Relative: 1 %
HCT: 41.2 % (ref 39.0–52.0)
Hemoglobin: 14.4 g/dL (ref 13.0–17.0)
Immature Granulocytes: 0 %
Lymphocytes Relative: 35 %
Lymphs Abs: 1.6 10*3/uL (ref 0.7–4.0)
MCH: 33.4 pg (ref 26.0–34.0)
MCHC: 35 g/dL (ref 30.0–36.0)
MCV: 95.6 fL (ref 80.0–100.0)
Monocytes Absolute: 0.4 10*3/uL (ref 0.1–1.0)
Monocytes Relative: 10 %
Neutro Abs: 2.4 10*3/uL (ref 1.7–7.7)
Neutrophils Relative %: 53 %
Platelets: 146 10*3/uL — ABNORMAL LOW (ref 150–400)
RBC: 4.31 MIL/uL (ref 4.22–5.81)
RDW: 13 % (ref 11.5–15.5)
WBC: 4.5 10*3/uL (ref 4.0–10.5)
nRBC: 0 % (ref 0.0–0.2)

## 2020-11-11 LAB — RAPID URINE DRUG SCREEN, HOSP PERFORMED
Amphetamines: NOT DETECTED
Barbiturates: NOT DETECTED
Benzodiazepines: NOT DETECTED
Cocaine: NOT DETECTED
Opiates: NOT DETECTED
Tetrahydrocannabinol: NOT DETECTED

## 2020-11-11 LAB — RESP PANEL BY RT-PCR (FLU A&B, COVID) ARPGX2
Influenza A by PCR: NEGATIVE
Influenza B by PCR: NEGATIVE
SARS Coronavirus 2 by RT PCR: NEGATIVE

## 2020-11-11 LAB — COMPREHENSIVE METABOLIC PANEL
ALT: 20 U/L (ref 0–44)
AST: 21 U/L (ref 15–41)
Albumin: 4.1 g/dL (ref 3.5–5.0)
Alkaline Phosphatase: 59 U/L (ref 38–126)
Anion gap: 8 (ref 5–15)
BUN: 16 mg/dL (ref 6–20)
CO2: 26 mmol/L (ref 22–32)
Calcium: 9.2 mg/dL (ref 8.9–10.3)
Chloride: 107 mmol/L (ref 98–111)
Creatinine, Ser: 0.91 mg/dL (ref 0.61–1.24)
GFR, Estimated: >60 mL/min (ref >60)
Glucose, Bld: 96 mg/dL (ref 70–99)
Potassium: 4.3 mmol/L (ref 3.5–5.1)
Sodium: 141 mmol/L (ref 135–145)
Total Bilirubin: 0.8 mg/dL (ref 0.3–1.2)
Total Protein: 6.8 g/dL (ref 6.5–8.1)

## 2020-11-11 LAB — ETHANOL: Alcohol, Ethyl (B): <10 mg/dL (ref <10)

## 2020-11-11 MED ORDER — ACETAMINOPHEN 650 MG RE SUPP: 650.0000 mg | Freq: Four times a day (QID) | RECTAL | Status: DC | PRN

## 2020-11-11 MED ORDER — ONDANSETRON HCL 4 MG PO TABS: 4.0000 mg | ORAL_TABLET | Freq: Four times a day (QID) | ORAL | Status: DC | PRN

## 2020-11-11 MED ORDER — IBUPROFEN 200 MG PO TABS
600.0000 mg | ORAL_TABLET | Freq: Four times a day (QID) | ORAL | Status: DC | PRN
  Administered 2020-11-12: 600 mg via ORAL
  Filled 2020-11-11: qty 1

## 2020-11-11 MED ORDER — FUROSEMIDE 20 MG PO TABS
40.0000 mg | ORAL_TABLET | Freq: Every day | ORAL | Status: DC
  Filled 2020-11-11: qty 2

## 2020-11-11 MED ORDER — ONDANSETRON HCL 4 MG/2ML IJ SOLN: 4.0000 mg | Freq: Four times a day (QID) | INTRAMUSCULAR | Status: DC | PRN

## 2020-11-11 MED ORDER — QUETIAPINE FUMARATE 200 MG PO TABS
200.0000 mg | ORAL_TABLET | Freq: Every day | ORAL | Status: DC
  Administered 2020-11-11: 200 mg via ORAL
  Filled 2020-11-11 (×2): qty 1

## 2020-11-11 MED ORDER — TRAZODONE HCL 50 MG PO TABS: 50.0000 mg | ORAL_TABLET | Freq: Every evening | ORAL | Status: DC | PRN

## 2020-11-11 MED ORDER — ACETAMINOPHEN 325 MG PO TABS: 650.0000 mg | ORAL_TABLET | Freq: Four times a day (QID) | ORAL | Status: DC | PRN

## 2020-11-11 MED ORDER — DIVALPROEX SODIUM 250 MG PO DR TAB: 500.0000 mg | DELAYED_RELEASE_TABLET | Freq: Two times a day (BID) | ORAL | Status: DC

## 2020-11-11 MED ORDER — DIVALPROEX SODIUM 250 MG PO DR TAB
500.0000 mg | DELAYED_RELEASE_TABLET | Freq: Two times a day (BID) | ORAL | Status: DC
  Administered 2020-11-11 – 2020-11-12 (×2): 500 mg via ORAL
  Filled 2020-11-11 (×2): qty 2

## 2020-11-11 MED ORDER — NICOTINE 14 MG/24HR TD PT24: 14.0000 mg | MEDICATED_PATCH | Freq: Every day | TRANSDERMAL | Status: DC

## 2020-11-11 NOTE — ED Notes (Signed)
According to previous rn and officers pt has been wanded by security

## 2020-11-11 NOTE — ED Provider Notes (Signed)
Patient placed in Quick Look pathway, seen and evaluated   Chief Complaint: IVC  HPI:   Suicidal thoughts  ROS: not eating or drinking normally  Physical Exam:   Gen: No distress  Neuro: Awake and Alert  Skin: Warm    Focused Exam: lungs clear heart rrr   Initiation of care has begun. The patient has been counseled on the process, plan, and necessity for staying for the completion/evaluation, and the remainder of the medical screening examination   Osie Cheeks 11/11/20 1803    Gwyneth Sprout, MD 11/11/20 2302
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
## 2020-11-11 NOTE — ED Notes (Signed)
Pts belongings stored in locker 14

## 2020-11-11 NOTE — ED Provider Notes (Addendum)
[facility] EMERGENCY DEPARTMENT Provider Note   CSN: [id number] Arrival date & time: 11/11/20  1742     History Chief Complaint  Patient presents with  . IVC    Gordon Martin is a 55 y.o. male.  HPI Patient is a 55 year old male with past medical history significant for bipolar, paranoid schizophrenia  He is currently off his medications and has been IVC-ED by  Patient has called and made bomb threats to high school in the area.  He seems to be worried about not being taken seriously for his mental health issues.  He states he does not take his medications.  He is followed by the [facility]  He states that he was committed for psychiatric reasons proximately 1 month ago.  He denies any SI, HI or AVH states that he would like to leave the state and not come back.  He is accompanied by [employer].     Past Medical History:  Diagnosis Date  . Bipolar disorder (HCC)   . Gout     Patient Active Problem List   Diagnosis Date Noted  . Schizoaffective disorder (HCC) 09/10/2014  . Schizoaffective disorder, bipolar type (HCC)     No past surgical history on file.     No family history on file.  Social History   Tobacco Use  . Smoking status: Never Smoker  Substance Use Topics  . Alcohol use: No  . Drug use: No    Home Medications Prior to Admission medications   Medication Sig Start Date End Date Taking? Authorizing Provider  divalproex (DEPAKOTE) 500 MG DR tablet Take 1 tablet (500 mg total) by mouth 2 (two) times daily before a meal. Patient not taking: Reported on 09/09/2014 06/03/14   Rankin, Shuvon B, NP  furosemide (LASIX) 40 MG tablet Take 1 tablet (40 mg total) by mouth daily. 09/11/14   Antony Madura, PA-C  ibuprofen (ADVIL,MOTRIN) 800 MG tablet Take 800 mg by mouth every 8 (eight) hours as needed for moderate pain.    [provider]  QUEtiapine (SEROQUEL) 200 MG tablet Take 1 tablet (200 mg total) by mouth at bedtime. Patient not  taking: Reported on 09/09/2014 06/03/14   Rankin, Shuvon B, NP  traZODone (DESYREL) 100 MG tablet Take 100 mg by mouth at bedtime.    [provider]    Allergies    Bee venom, Haloperidol and related, Risperidone and related, and Zyprexa relprevv [olanzapine pamoate]  Review of Systems   Review of Systems  Constitutional: Negative for chills and fever.  HENT: Negative for congestion.   Eyes: Negative for pain.  Respiratory: Negative for cough and shortness of breath.   Cardiovascular: Negative for chest pain and leg swelling.  Gastrointestinal: Negative for abdominal pain and vomiting.  Genitourinary: Negative for dysuria.  Musculoskeletal: Negative for myalgias.  Skin: Negative for rash.  Neurological: Negative for dizziness and headaches.  Psychiatric/Behavioral: Positive for sleep disturbance. Negative for hallucinations and suicidal ideas. The patient is nervous/anxious.     Physical Exam Updated Vital Signs BP 136/84 (BP Location: Left Arm)   Pulse 64   Temp 97.9 F (36.6 C) (Oral)   Resp 17   Ht 5\' 11"  (1.803 m)   Wt 83.9 kg   SpO2 99%   BMI 25.80 kg/m   Physical Exam Vitals and nursing note reviewed.  Constitutional:      General: He is not in acute distress. HENT:     Head: Normocephalic and atraumatic.  Nose: Nose normal.  Eyes:     General: No scleral icterus. Cardiovascular:     Rate and Rhythm: Normal rate and regular rhythm.     Pulses: Normal pulses.     Heart sounds: Normal heart sounds.  Pulmonary:     Effort: Pulmonary effort is normal. No respiratory distress.     Breath sounds: No wheezing.  Abdominal:     Palpations: Abdomen is soft.     Tenderness: There is no abdominal tenderness.  Musculoskeletal:     Cervical back: Normal range of motion.     Right lower leg: No edema.     Left lower leg: No edema.  Skin:    General: Skin is warm and dry.     Capillary Refill: Capillary refill takes less than 2 seconds.  Neurological:      Mental Status: He is alert. Mental status is at baseline.  Psychiatric:     Comments: Pressured speech, seems to have linear thoughts, somewhat agitated but easily redirectable.     ED Results / Procedures / Treatments   Labs (all labs ordered are listed, but only abnormal results are displayed) Labs Reviewed  CBC WITH DIFFERENTIAL/PLATELET - Abnormal; Notable for the following components:      Result Value   Platelets 146 (*)    All other components within normal limits  RESP PANEL BY RT-PCR (FLU A&B, COVID) ARPGX2  COMPREHENSIVE METABOLIC PANEL  ETHANOL  RAPID URINE DRUG SCREEN, HOSP PERFORMED    EKG None  Radiology No results found.  Procedures Procedures   Medications Ordered in ED Medications  acetaminophen (TYLENOL) tablet 650 mg (has no administration in time range)    Or  acetaminophen (TYLENOL) suppository 650 mg (has no administration in time range)  ondansetron (ZOFRAN) tablet 4 mg (has no administration in time range)    Or  ondansetron (ZOFRAN) injection 4 mg (has no administration in time range)  nicotine (NICODERM CQ - dosed in mg/24 hours) patch 14 mg (14 mg Transdermal Patient Refused/Not Given 11/11/20 2014)  traZODone (DESYREL) tablet 50 mg (has no administration in time range)  QUEtiapine (SEROQUEL) tablet 200 mg (has no administration in time range)  furosemide (LASIX) tablet 40 mg (40 mg Oral Patient Refused/Not Given 11/11/20 2014)  divalproex (DEPAKOTE) DR tablet 500 mg (has no administration in time range)    ED Course  I have reviewed the triage vital signs and the nursing notes.  Pertinent labs & imaging results that were available during my care of the patient were reviewed by me and considered in my medical decision making (see chart for details).    MDM Rules/Calculators/A&P                          Patient with psychiatric history including paranoia on Seroquel not taking this medication.  Apparently has made bomb threats at local high  school.  Was brought in by [facility] after placed under IVC by sister.   He has been arrested after making bomb threats to a high school. Brought in by [employer]. Will re-order his home medications including seroquel and depakote and complete first exam with Dr. Jacqulyn Bath as attending.   Pt is under IVC (first exam complete and given to Diplomatic Services operational officer). Is redirectable at this time.   He has no complaints at this time.  CMP unremarkable, UDS negative, ethanol undetectable, CBC unremarkable  Covid pending at this time.  Patient is medically clear.  Diet is been  ordered, food is been provided and pt is directable and agreeable at this time.   Final Clinical Impression(s) / ED Diagnoses Final diagnoses:  Paranoid [facility])    Rx / DC Orders ED Discharge Orders    None       Gailen Shelter, [place] 11/11/20 2027    Gailen Shelter, [place] 11/11/20 2028    Maia Plan, MD 11/11/20 [phone]

## 2020-11-11 NOTE — ED Notes (Signed)
Pts iphone and wallet locked away with security

## 2020-11-12 ENCOUNTER — Inpatient Hospital Stay (HOSPITAL_COMMUNITY)
Admission: AD | Admit: 2020-11-12 | Discharge: 2020-12-01 | DRG: 885 | Disposition: A | Discharge status: Home / Self Care | Payer: Medicare PPO | Source: Intra-hospital | Attending: Psychiatry | Admitting: Psychiatry

## 2020-11-12 ENCOUNTER — Encounter (HOSPITAL_COMMUNITY): Payer: Self-pay | Admitting: Family

## 2020-11-12 ENCOUNTER — Other Ambulatory Visit: Payer: Self-pay

## 2020-11-12 DIAGNOSIS — D696 Thrombocytopenia, unspecified: Secondary | ICD-10-CM | POA: Diagnosis present

## 2020-11-12 DIAGNOSIS — Z20822 Contact with and (suspected) exposure to covid-19: Secondary | ICD-10-CM | POA: Diagnosis present

## 2020-11-12 DIAGNOSIS — E876 Hypokalemia: Secondary | ICD-10-CM | POA: Diagnosis present

## 2020-11-12 DIAGNOSIS — M109 Gout, unspecified: Secondary | ICD-10-CM | POA: Diagnosis present

## 2020-11-12 DIAGNOSIS — I10 Essential (primary) hypertension: Secondary | ICD-10-CM | POA: Diagnosis present

## 2020-11-12 DIAGNOSIS — F250 Schizoaffective disorder, bipolar type: Secondary | ICD-10-CM | POA: Diagnosis present

## 2020-11-12 DIAGNOSIS — E781 Pure hyperglyceridemia: Secondary | ICD-10-CM | POA: Diagnosis present

## 2020-11-12 DIAGNOSIS — K5900 Constipation, unspecified: Secondary | ICD-10-CM | POA: Diagnosis present

## 2020-11-12 DIAGNOSIS — Z9114 Patient's other noncompliance with medication regimen: Secondary | ICD-10-CM | POA: Diagnosis not present

## 2020-11-12 DIAGNOSIS — G4700 Insomnia, unspecified: Secondary | ICD-10-CM | POA: Diagnosis present

## 2020-11-12 DIAGNOSIS — F22 Delusional disorders: Secondary | ICD-10-CM | POA: Diagnosis not present

## 2020-11-12 DIAGNOSIS — Z79899 Other long term (current) drug therapy: Secondary | ICD-10-CM | POA: Diagnosis not present

## 2020-11-12 LAB — VALPROIC ACID LEVEL: Valproic Acid Lvl: 70 ug/mL (ref 50.0–100.0)

## 2020-11-12 MED ORDER — DIVALPROEX SODIUM 500 MG PO DR TAB
500.0000 mg | DELAYED_RELEASE_TABLET | Freq: Two times a day (BID) | ORAL | Status: DC
Start: 2020-11-12 — End: 2020-11-13
  Administered 2020-11-12 – 2020-11-13 (×2): 500 mg via ORAL
  Filled 2020-11-12 (×4): qty 1

## 2020-11-12 MED ORDER — ACETAMINOPHEN 325 MG PO TABS: 650.0000 mg | ORAL_TABLET | Freq: Four times a day (QID) | ORAL | Status: DC | PRN

## 2020-11-12 MED ORDER — NICOTINE 14 MG/24HR TD PT24
14.0000 mg | MEDICATED_PATCH | Freq: Every day | TRANSDERMAL | Status: DC
  Filled 2020-11-12 (×6): qty 1

## 2020-11-12 MED ORDER — ALUM & MAG HYDROXIDE-SIMETH 200-200-20 MG/5ML PO SUSP: 30.0000 mL | ORAL | Status: DC | PRN

## 2020-11-12 MED ORDER — ZIPRASIDONE MESYLATE 20 MG IM SOLR: 20.0000 mg | Freq: Four times a day (QID) | INTRAMUSCULAR | Status: DC | PRN

## 2020-11-12 MED ORDER — TRAZODONE HCL 100 MG PO TABS
100.0000 mg | ORAL_TABLET | Freq: Every evening | ORAL | Status: DC | PRN
  Administered 2020-11-12 – 2020-11-30 (×19): 100 mg via ORAL
  Filled 2020-11-12 (×6): qty 1
  Filled 2020-11-12: qty 5
  Filled 2020-11-12 (×13): qty 1

## 2020-11-12 MED ORDER — ENSURE ENLIVE PO LIQD
237.0000 mL | Freq: Two times a day (BID) | ORAL | Status: DC
  Administered 2020-11-12 – 2020-11-30 (×35): 237 mL via ORAL
  Filled 2020-11-12 (×42): qty 237

## 2020-11-12 MED ORDER — QUETIAPINE FUMARATE 100 MG PO TABS: 100.0000 mg | ORAL_TABLET | Freq: Three times a day (TID) | ORAL | Status: DC | PRN

## 2020-11-12 MED ORDER — QUETIAPINE FUMARATE 200 MG PO TABS
200.0000 mg | ORAL_TABLET | Freq: Every day | ORAL | Status: DC
  Administered 2020-11-12: 200 mg via ORAL
  Filled 2020-11-12 (×2): qty 1

## 2020-11-12 MED ORDER — MAGNESIUM HYDROXIDE 400 MG/5ML PO SUSP: 30.0000 mL | Freq: Every day | ORAL | Status: DC | PRN

## 2020-11-12 MED ORDER — LORAZEPAM 1 MG PO TABS: 2.0000 mg | ORAL_TABLET | Freq: Four times a day (QID) | ORAL | Status: DC | PRN

## 2020-11-12 MED ORDER — TRAZODONE HCL 50 MG PO TABS: 50.0000 mg | ORAL_TABLET | Freq: Every evening | ORAL | Status: DC | PRN

## 2020-11-12 MED ORDER — FUROSEMIDE 40 MG PO TABS
40.0000 mg | ORAL_TABLET | Freq: Every day | ORAL | Status: DC
  Administered 2020-11-13 – 2020-11-17 (×5): 40 mg via ORAL
  Filled 2020-11-12 (×6): qty 1

## 2020-11-12 MED ORDER — HYDROXYZINE HCL 50 MG PO TABS
50.0000 mg | ORAL_TABLET | Freq: Three times a day (TID) | ORAL | Status: DC | PRN
  Administered 2020-11-12 – 2020-11-30 (×18): 50 mg via ORAL
  Filled 2020-11-12: qty 15
  Filled 2020-11-12 (×18): qty 1

## 2020-11-12 NOTE — ED Notes (Signed)
Medication stored with pharmacy, serial number [id number]

## 2020-11-12 NOTE — Progress Notes (Signed)
Patient was accepted to [facility] adult unit.   Meets inpatient criteria per Doran Heater, NP.    Attending physician is Dr. Jola Babinski, MD. .  Patient is IVC.    Patient can be transported by Patent examiner.  .  Notified Anne Ng, RN of acceptance.  Nurses call report to [facility] [phone].    Patient can arrive at 11/12/2020 after 12:00pm.  Penni Homans, MSW, LCSW 11/12/2020 11:39 AM

## 2020-11-12 NOTE — [facility] Group Notes (Signed)
Occupational Therapy Group Note Date: 11/12/2020 Group Topic/Focus: Sensory Modulation  Group Description: Group encouraged increased participation and engagement through discussion focused on sensory modulation and self-soothing through use of the 8 senses. Discussion introduced the concept of sensory modulation and integration, focusing on how we can utilize our body and it's senses to self-soothe or cope, when we are experiencing an over or under-whelming sensation or feeling. Group members were introduced to a sensory diet checklist as a helpful tool/resource that can be utilized to identify what activities and strategies we prefer and do not prefer based upon our response to different stimulus. The concept of alerting vs calming activities was also introduced to understand how to counteract how we are feeling (Example: when we are feeling overwhelmed/stressed, engage in something calming. When we are feeling depressed/low energy, engage in something alerting). Group members engaged actively in discussion sharing their own personal sensory likes/dislikes.  Participation Level: Active   Participation Quality: Moderate Cues   Behavior: Hyperverbal and Restless   Speech/Thought Process: Disorganized and Tangential   Affect/Mood: Full range   Insight: Limited   Judgement: Limited   Individualization: Gordon Martin was active in their participation of group discussion/activity, though notably tangential and disorganized in his thought process. Pt identified "riding a bike, walking, and chewing ice" as helpful sensory strategies he uses to cope. He often spoke out of turn throughout group, at one point derailing topic and sharing a story in which he put vodka in his hummingbird feeder to "watch the birds struggle to find the hole" (to drink out of). Difficult to re-direct.   Modes of Intervention: Activity, Discussion and Education  Patient Response to Interventions:  Challenging   Plan: Continue to engage  patient in OT groups 2 - 3x/week.  11/12/2020  Donne Hazel, MOT, OTR/L

## 2020-11-12 NOTE — ED Notes (Signed)
Valuables, belongings, and meds that were logged into pharmacy all handed over to the [facility] officer team for transport to [facility]. Patient alert, calm and cooperative at this time. Patient with VSS. Rolling call given to AJ at [facility].

## 2020-11-12 NOTE — BHH Suicide Risk Assessment (Signed)
[facility] Admission Suicide Risk Assessment   Nursing information obtained from:    Demographic factors:    Current Mental Status:    Loss Factors:    Historical Factors:    Risk Reduction Factors:     Total Time spent with patient: 45 minutes Principal Problem: <principal problem not specified> Diagnosis:  Active Problems:   Schizoaffective disorder, bipolar type (HCC)  Subjective Data: Patient is seen and examined.  Patient is a 55 year old male with a reported past psychiatric history significant for schizoaffective disorder; bipolar type who was brought to the [facility] emergency department on 11/11/2020 under involuntary commitment.  His sister apparently took out involuntary commitment paperwork.  The involuntary commitment paperwork stated that he had a history of violent behavior.  It says in the notes that he was brought in by [facility] police after making bomb threats at a local high school.  He does have a history of violent behavior.  He apparently was not taking his medicines.  He stated that he had been in jail over the last 100 days or more.  He was given medication, but he does not remember what it was.  He stated that they were not giving him the Seroquel and Depakote which she had been prescribed by the [facility] hospital.  He denies suicidal or homicidal ideation.  He denied any auditory or visual hallucinations.  He stated that his sister took out the involuntary commitment paperwork because "she is against me".  He stated that his sister has apparently "vacated his apartment", but he states he has access to an assisted living facility which was arranged for him to go to after he was released from prison.  He stated he had been in prison multiple times in the past.  Trying to inquire what had gone on most recently.  Review of the electronic medical record revealed a hospitalization at [facility] in October 2021.  He stated that after that he was transferred to the  [facility]  Shortly after he was released from the [facility] he apparently went to jail.  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  He does have some paranoia, but very vague.  He is a terrible historian.  He stated that he has had over 30 hospitalizations in the past.  He has been admitted to [address] [facility], [facility], [place], other hospitals throughout the state.  The records from the [facility] hospitalization had them on Seroquel and lithium.  He stated that he had "a heart attack on lithium".  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  He was admitted to the hospital for evaluation and stabilization.  Continued Clinical Symptoms:    The "Alcohol Use Disorders Identification Test", Guidelines for Use in Primary Care, Second Edition.  World Science writer [facility]). Score between 0-7:  no or low risk or alcohol related problems. Score between 8-15:  moderate risk of alcohol related problems. Score between 16-19:  high risk of alcohol related problems. Score 20 or above:  warrants further diagnostic evaluation for alcohol dependence and treatment.   CLINICAL FACTORS:   Schizophrenia:   Paranoid or undifferentiated type   Musculoskeletal: Strength & Muscle Tone: within normal limits Gait & Station: normal Patient leans: N/A  Psychiatric Specialty Exam:  Presentation  General Appearance: Disheveled  Eye Contact:Fair  Speech:Normal Rate  Speech Volume:Normal  Handedness:Right   Mood  and Affect  Mood:Euthymic  Affect:Congruent   Thought Process  Thought Processes:Goal Directed  Descriptions of Associations:Circumstantial  Orientation:Full (Time, Place and Person)  Thought Content:Delusions; Paranoid Ideation  History of Schizophrenia/Schizoaffective disorder:Yes  Duration of Psychotic Symptoms:Greater than six months  Hallucinations:Hallucinations:  None  Ideas of Reference:Paranoia; Delusions  Suicidal Thoughts:Suicidal Thoughts: No  Homicidal Thoughts:Homicidal Thoughts: No   Sensorium  Memory:Immediate Fair; Recent Fair; Remote Fair  Judgment:Intact  Insight:Fair   Executive Functions  Concentration:Good  Attention Span:Good  Recall:Fair  Fund of Knowledge:Fair  Language:Good   Psychomotor Activity  Psychomotor Activity:Psychomotor Activity: Normal   Assets  Assets:Desire for Improvement; Resilience   Sleep  Sleep:Sleep: Fair    Physical Exam: Physical Exam Vitals and nursing note reviewed.  HENT:     Head: Normocephalic and atraumatic.  Pulmonary:     Effort: Pulmonary effort is normal.  Neurological:     General: No focal deficit present.     Mental Status: He is alert and oriented to person, place, and time.    ROS There were no vitals taken for this visit. There is no height or weight on file to calculate BMI.   COGNITIVE FEATURES THAT CONTRIBUTE TO RISK:  Closed-mindedness    SUICIDE RISK:   Moderate:  Frequent suicidal ideation with limited intensity, and duration, some specificity in terms of plans, no associated intent, good self-control, limited dysphoria/symptomatology, some risk factors present, and identifiable protective factors, including available and accessible social support.  PLAN OF CARE: Patient is seen and examined.  Patient is a 55 year old male with the above-stated past psychiatric history who was admitted secondary to an involuntary commitment.  The entire story is very vague, and I do not truly understand at this point what is going on.  He does not appear to be either homicidal nor suicidal.  He is vague with related to the criminal charges that led to this most recent incarceration.  He is not pressured, not tangential.  He apparently admitted to auditory and visual hallucinations at the [facility], but denies those with me.  He has not given me permission to talk to his  sister so are rather limited there.  He is not sure what he was taking medication wise while he was in jail, but was placed on Depakote and Seroquel at the [facility].  Depakote level was not obtained.  I have gone on and written for 1 from previously drawn blood just in case.  Review of his admission laboratories showed normal electrolytes including liver function enzymes.  His CBC was essentially normal except for a low normal platelet count.  That may be due to the Depakote.  Review of the electronic medical record revealed that at least 6 years ago his platelet count was between 200,000 to 160,000.  Differential was normal.  Blood alcohol was less than 10, respiratory panel for influenza A, B and coronavirus were all negative.  Drug screen was negative.  He has had an EKG, but the results are not in the computer.  His vital signs are stable, he is afebrile.  His pulse oximetry on room air was 98%.  I certify that inpatient services furnished can reasonably be expected to improve the patient's condition.   [name], MD 11/12/2020, 2:12 PM

## 2020-11-12 NOTE — Progress Notes (Signed)
Patient under review at [facility].  Penni Homans, MSW, LCSW 11/12/2020 10:40 AM

## 2020-11-12 NOTE — Progress Notes (Addendum)
Pt stated he does not sleep and nothing helps him to sleep. Pt did not want to take the Trazodone and Vistaril with his HS medication, but pt agreed, so [t given PRN Trazodone and Vistaril per MAR with HS Seroquel . Pt was encouraged to discuss past medications with the doctor. Pt stated he was on a high level of Thorazine and Seroquel and he still did not sleep.     11/12/20 2300  Psych Admission Type (Psych Patients Only)  Admission Status Involuntary  Psychosocial Assessment  Patient Complaints Restlessness;Suspiciousness  Eye Contact Brief  Facial Expression Anxious  Affect Appropriate to circumstance  Speech Logical/coherent  Interaction Assertive  Motor Activity Slow  Appearance/Hygiene In scrubs  Mood Suspicious;Preoccupied  Thought Process  Coherency Tangential;Circumstantial  Content Blaming others;Preoccupation;Paranoia  Delusions Grandeur  Perception Derealization  Hallucination None reported or observed  Judgment Poor  Confusion None  Danger to Self  Current suicidal ideation? Denies  Danger to Others  Danger to Others None reported or observed

## 2020-11-12 NOTE — BH Assessment (Signed)
  Disposition [name], PA, recommends overnight observation for safety and stabilization with psych reassessment in the AM. [name], RN, informed of disposition.

## 2020-11-12 NOTE — ED Notes (Signed)
Patient care assumed at this time. Patient on stretcher with sitter at bedside for safety. Patient currently talking about when he had days of lying in the floor and drinking his pee pee as an ancient [place] secret that draws the water out. He says the 9th circuit of appeals and supreme court will not lok favorably on them if they put him in the [place] because they don't have probable cause to put him in the hospital. 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 Mother died of asbestos poisoning. He is listing numbers of significance ([phone], [id number]).  Orlene Erm is one sided, delusions of grandeur noted. Patient very tangential speech.  Will continue to assess patient safety and patient's needs. Respirations even and unlabored. No distress noted.

## 2020-11-12 NOTE — Progress Notes (Signed)
Admission Note: Patient is a 55 year old male admitted to the unit for symptoms of paranoia, medication noncompliance and audiovisual hallucinations.  Per report, patient IVC'd by sister after making bomb threat to a local school.  Patient report hearing and seeing angelic beings.  Patient is alert and oriented x 4.  Presents with a calm affect and mood.  Admission plan of care reviewed and consent signed.  Skin assessment and personal belongings completed.  Skin is dry and intact.  No contraband found.  Patient oriented to the unit, staff and room.  Routine safety checks initiated.  Verbalizes understanding of unit rules/protocols.  Patient is safe on the unit.

## 2020-11-12 NOTE — Tx Team (Signed)
Initial Treatment Plan 11/12/2020 2:47 PM Gordon Martin BTD:[id number]    PATIENT STRESSORS: Financial difficulties Health problems Legal issue Marital or family conflict Medication change or noncompliance   PATIENT STRENGTHS: Average or above average intelligence Capable of independent living Supportive family/friends   PATIENT IDENTIFIED PROBLEMS: "Getting out of here"  "leaving her out of the equation"  Medication noncompliance  Paranoia  Audiovisual hallucinations             DISCHARGE CRITERIA:  Ability to meet basic life and health needs Adequate post-discharge living arrangements  PRELIMINARY DISCHARGE PLAN: Attend aftercare/continuing care group Outpatient therapy Return to previous living arrangement  PATIENT/FAMILY INVOLVEMENT: This treatment plan has been presented to and reviewed with the patient, Gordon Martin.  The patient and family have been given the opportunity to ask questions and make suggestions.  Clarene Critchley, RN 11/12/2020, 2:47 PM

## 2020-11-12 NOTE — ED Notes (Signed)
Once patient is stabilized, patient under guardianship of DSS and has a private agent working to find perm placement. Authorized by Theodosia Blender guardian to give information to Carlota Raspberry, placement specialist. His number [phone].
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
## 2020-11-12 NOTE — BH Assessment (Addendum)
*  Patient's Guardian with [facility] DSS is [place]. Her contact # is [phone]. Please contact guardian with details and/or updates regarding patient's care, treatment, etc.  *Daytona Retana is patient's Placement Coordinator [phone]. Please contact [facility] for discharge planning.

## 2020-11-12 NOTE — BH Assessment (Signed)
Comprehensive Clinical Assessment (CCA) Note  11/12/2020 [name] [id number]   Disposition Gordon Back, PA, recommends overnight observation for safety and stabilization with psych reassessment in the AM. [name], RN, informed of disposition.   The patient demonstrates the following risk factors for suicide: Chronic risk factors for suicide include: psychiatric disorder of per patient schizoaffective disorder and previous suicide attempts one time in 1997. Acute risk factors for suicide include: family or marital conflict, loss (financial, interpersonal, professional) and recent discharge from inpatient psychiatry. Protective factors for this patient include: positive social support, coping skills and hope for the future. Considering these factors, the overall suicide risk at this point appears to be moderate. Patient is appropriate for outpatient follow up.  Flowsheet Row ED from 11/11/2020 in [facility] EMERGENCY DEPARTMENT  C-SSRS RISK CATEGORY Error: Q3, 4, or 5 should not be populated when Q2 is No     Patient is a 55 year old male presenting under IVC due to making bomb threats to local school. Patient reported sister completed IVC paperwork. Patient denied IVC information. Patient stated I don't know why sister is trying to recommit me. [facility]. Patient reported in 1997 holding a gun to himself in attempt. 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 Patient was very talkative and cooperative.   Collateral contact attempted, guardianship SW, [place], [phone], unable to contact. Gordon Martin is Water engineer.   PER IVC Patient with hx of schizoaffective disorder. Has been brought in by [facility] after making bomb threats at local high school. History of violent behavior. He is not taking his medications. Expresses delusions to examiner and may very well pose threats to others safety, certainly  to himself.   Chief Complaint:  Chief Complaint  Patient presents with  . IVC   Visit Diagnosis: hx of schizoaffective disorder  CCA Screening, Triage and Referral (STR)  Patient Reported Information How did you hear about [place]? Family/Friend  Referral name: No data recorded Referral phone number: No data recorded  Whom do you see for routine medical problems? No data recorded Practice/Facility Name: No data recorded Practice/Facility Phone Number: No data recorded Name of Contact: No data recorded Contact Number: No data recorded Contact Fax Number: No data recorded Prescriber Name: No data recorded Prescriber Address (if known): No data recorded  What Is the Reason for Your Visit/Call Today? [place], bomb threats to local high school, per IVC  How Long Has This Been Causing You Problems? <Week  What Do You Feel Would Help You the Most Today? No data recorded  Have You Recently Been in Any Inpatient Treatment (Hospital/Detox/Crisis Center/28-Day Program)? No  Name/Location of Program/Hospital:No data recorded How Long Were You There? No data recorded When Were You Discharged? No data recorded  Have You Ever Received Services From [facility] Before? No  Who Do You See at [facility]? No data recorded  Have You Recently Had Any Thoughts About Hurting Yourself? No  Are You Planning to Commit Suicide/Harm Yourself At This time? No  Have you Recently Had Thoughts About Hurting Someone Gordon Martin? No  Explanation: No data recorded  Have You Used Any Alcohol or Drugs in the Past 24 Hours? No  How Long Ago Did You Use Drugs or Alcohol? No data recorded What Did You Use and How Much? No data recorded  Do You Currently Have a Therapist/Psychiatrist? No  Name of Therapist/Psychiatrist: No data recorded  Have You Been Recently  Discharged From Any Office Practice or Programs? No  Explanation of Discharge From Practice/Program: No data recorded  CCA Screening Triage Referral  Assessment Type of Contact: Tele-Assessment  Is this Initial or Reassessment? Initial Assessment  Date Telepsych consult ordered in [facility]:  11/11/2020  Time Telepsych consult ordered in [facility]:  1945  Patient Reported Information Reviewed? Yes  Patient Left Without Being Seen? No data recorded Reason for Not Completing Assessment: No data recorded  Collateral Involvement: none reported  Does Patient Have a Court Appointed Legal Guardian? No data recorded Name and Contact of Legal Guardian: No data recorded If Minor and Not Living with Parent(s), Who has Custody? No data recorded Is CPS involved or ever been involved? Never  Is APS involved or ever been involved? Never  Patient Determined To Be At Risk for Harm To Self or Others Based on Review of Patient Reported Information or Presenting Complaint? -- (Patient denied)  Method: No data recorded Availability of Means: No data recorded Intent: No data recorded Notification Required: No data recorded Additional Information for Danger to Others Potential: No data recorded Additional Comments for Danger to Others Potential: No data recorded Are There Guns or Other Weapons in Your Home? -- ("I plead the fifth")  Types of Guns/Weapons: No data recorded Are These Weapons Safely Secured?                            No data recorded Who Could Verify You Are Able To Have These Secured: No data recorded Do You Have any Outstanding Charges, Pending Court Dates, Parole/Probation? denied  Contacted To Inform of Risk of Harm To Self or Others: Law Enforcement  Location of Assessment: [facility]  Does Patient Present under Involuntary Commitment? Yes  IVC Papers Initial File Date: 11/11/2020 [place] of Residence: [place]  Patient Currently Receiving the Following Services: Not Receiving Services  Determination of Need: Emergent (2 hours)  Options For Referral: Outpatient Therapy; Medication Management  CCA Biopsychosocial Intake/Chief  Complaint:  Per IVC, [place] bomb threats to local school, off medications.  Current Symptoms/Problems: Per IVC, [place] bomb threats to local school, off medications.  Patient Reported Schizophrenia/Schizoaffective Diagnosis in Past: No data recorded  Strengths: uta  Preferences: uta  Abilities: uta  Type of Services Patient Feels are Needed: "none"  Initial Clinical Notes/Concerns: No data recorded  Mental Health Symptoms Depression:  None   Duration of Depressive symptoms: No data recorded  Mania:  None   Anxiety:   None   Psychosis:  None   Duration of Psychotic symptoms: No data recorded  Trauma:  None   Obsessions:  None   Compulsions:  None   Inattention:  None   Hyperactivity/Impulsivity:  N/A   Oppositional/Defiant Behaviors:  None   Emotional Irregularity:  None   Other Mood/Personality Symptoms:  No data recorded   Mental Status Exam Appearance and self-care  Stature:  Average   Weight:  Average weight   Clothing:  Neat/clean   Grooming:  Normal   Cosmetic use:  None   Posture/gait:  Normal   Motor activity:  Not Remarkable   Sensorium  Attention:  Normal   Concentration:  Normal   Orientation:  X5   Recall/memory:  Normal   Affect and Mood  Affect:  Appropriate; Anxious   Mood:  Anxious   Relating  Eye contact:  Normal   Facial expression:  Responsive   Attitude toward examiner:  Cooperative   Thought  and Language  Speech flow: Normal   Thought content:  Appropriate to Mood and Circumstances   Preoccupation:  None   Hallucinations:  None   Organization:  No data recorded  [employer] of Knowledge:  Average   Intelligence:  Average   Abstraction:  Normal   Judgement:  Poor   Reality Testing:  No data recorded  Insight:  Poor   Decision Making:  Impulsive   Social Functioning  Social Maturity:  No data recorded  Social Judgement:  No data recorded  Stress  Stressors:  Transitions   Coping  Ability:  Deficient supports   Skill Deficits:  Decision making   Supports:  Support needed    Leisure/Recreation: Leisure / Recreation Do You Have Hobbies?: Yes Leisure and Hobbies: Biochemist, clinical  Exercise/Diet: Exercise/Diet Do You Have Any Trouble Sleeping?: Yes  CCA Employment/Education Employment/Work Situation: Employment / Work Situation Employment situation: Unemployed Has patient ever been in the [employer]?: Yes (Describe in comment)  Education: Education Last Grade Completed: 12 Did Garment/textile technologist From [employer]?: Yes Did You Have An Individualized Education Program (IIEP): No Did You Have Any Difficulty At [employer]?: No Patient's Education Has Been Impacted by Current Illness: No  CCA Family/Childhood History Family and Relationship History: Family history Does patient have children?: No  Childhood History:  Childhood History Description of patient's relationship with caregiver when they were a child: uta Patient's description of current relationship with people who raised him/her: uta How were you disciplined when you got in trouble as a child/adolescent?: uta Does patient have siblings?: Yes Number of Siblings: 1 Description of patient's current relationship with siblings: good Did patient suffer any verbal/emotional/physical/sexual abuse as a child?: No Did patient suffer from severe childhood neglect?: No Has patient ever been sexually abused/assaulted/raped as an adolescent or adult?: No Was the patient ever a victim of a crime or a disaster?: Yes 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 disaster: military  Child/Adolescent Assessment:   CCA Substance Use Alcohol/Drug Use: Alcohol / Drug Use Pain Medications: see MAR Prescriptions: see MAR Over the Counter: see MAR History of alcohol / drug use?: No history of alcohol / drug abuse   ASAM's:  Six Dimensions of Multidimensional Assessment  Dimension 1:  Acute Intoxication and/or  Withdrawal Potential:      Dimension 2:  Biomedical Conditions and Complications:      Dimension 3:  Emotional, Behavioral, or Cognitive Conditions and Complications:     Dimension 4:  Readiness to Change:     Dimension 5:  Relapse, Continued use, or Continued Problem Potential:     Dimension 6:  Recovery/Living Environment:     ASAM Severity Score:    ASAM Recommended Level of Treatment:     Substance use Disorder (SUD)   Recommendations for Services/Supports/Treatments: Recommendations for Services/Supports/Treatments Recommendations For Services/Supports/Treatments: Individual Therapy,Medication Management  DSM5 Diagnoses: Patient Active Problem List   Diagnosis Date Noted  . Schizoaffective disorder (HCC) 09/10/2014  . Schizoaffective disorder, bipolar type [facility])    Patient Centered Plan: Patient is on the following Treatment Plan(s):   Referrals to Alternative Service(s): Referred to Alternative Service(s):   Place:   Date:   Time:    Referred to Alternative Service(s):   Place:   Date:   Time:    Referred to Alternative Service(s):   Place:   Date:   Time:    Referred to Alternative Service(s):   Place:   Date:   Time:  Burnetta Sabin, [facility]
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
## 2020-11-13 DIAGNOSIS — F250 Schizoaffective disorder, bipolar type: Secondary | ICD-10-CM | POA: Diagnosis not present

## 2020-11-13 MED ORDER — POTASSIUM CHLORIDE CRYS ER 20 MEQ PO TBCR
20.0000 meq | EXTENDED_RELEASE_TABLET | Freq: Every day | ORAL | Status: DC
  Administered 2020-11-13 – 2020-11-17 (×5): 20 meq via ORAL
  Filled 2020-11-13 (×6): qty 1

## 2020-11-13 MED ORDER — DIVALPROEX SODIUM 250 MG PO DR TAB
750.0000 mg | DELAYED_RELEASE_TABLET | Freq: Two times a day (BID) | ORAL | Status: DC
  Administered 2020-11-13 – 2020-11-16 (×6): 750 mg via ORAL
  Filled 2020-11-13 (×8): qty 3

## 2020-11-13 MED ORDER — QUETIAPINE FUMARATE 400 MG PO TABS
400.0000 mg | ORAL_TABLET | Freq: Every day | ORAL | Status: DC
  Administered 2020-11-13: 400 mg via ORAL
  Filled 2020-11-13 (×2): qty 1

## 2020-11-13 MED ORDER — QUETIAPINE FUMARATE 100 MG PO TABS
100.0000 mg | ORAL_TABLET | ORAL | Status: AC
  Administered 2020-11-13: 100 mg via ORAL
  Filled 2020-11-13 (×2): qty 1

## 2020-11-13 MED ORDER — ADULT MULTIVITAMIN W/MINERALS CH
1.0000 | ORAL_TABLET | Freq: Every day | ORAL | Status: DC
  Administered 2020-11-13 – 2020-12-01 (×19): 1 via ORAL
  Filled 2020-11-13 (×5): qty 1
  Filled 2020-11-13: qty 7
  Filled 2020-11-13 (×16): qty 1

## 2020-11-13 NOTE — Progress Notes (Signed)
Nutrition Brief Note RD working remotely.  Patient identified on the Malnutrition Screening Tool (MST) Report  Wt Readings from Last 15 Encounters:  11/12/20 88.5 kg  11/11/20 83.9 kg  05/30/14 100.8 kg    Body mass index is 27.2 kg/m. Patient meets criteria for overweight status based on current BMI. Limited wt hx available.   Current diet order is Regular and patient is eating as desired for meals and snacks. Labs and medications reviewed.   Ensure Enlive was ordered BID per ONS protocol. Each supplement provides 350 kcal and 20 grams protein. Patient accepted bottle of the supplement this AM.  RN note from this AM indicates patient requested that he receive a multivitamin during admission; will order at this time.   No additional nutrition interventions warranted at this time. If nutrition issues arise, please consult RD.      Trenton Gammon, MS, RD, LDN, CNSC Inpatient Clinical Dietitian RD pager # available in [facility]  After hours/weekend pager # available in [facility]

## 2020-11-13 NOTE — Progress Notes (Signed)
Recreation Therapy Notes  INPATIENT RECREATION THERAPY ASSESSMENT  Patient Details Name: KAHLEL PEAKE MRN: [id number] DOB: [birth year] Today's Date: 11/13/2020       Information Obtained From: Patient  Able to Participate in Assessment/Interview: Yes  Patient Presentation: Alert  Reason for Admission (Per Patient): Other (Comments) (Pt stated sister had him committed repeatedly in last 6 months.)  Patient Stressors: Family  Coping Skills:   Journal,Aggression,Music,Talk,Prayer,Read  Leisure Interests (2+):  Games - Other (Comment) Dance movement psychotherapist)  Frequency of Recreation/Participation: Other (Comment) (Daily)  Awareness of Community Resources:  Yes  Community Resources:  Library  Current Use: Yes  If no, Barriers?:    Expressed Interest in [employer] Information: No  [employer] of Residence:  Engineer, technical sales  Patient Main Form of Transportation: Therapist, music  Patient Strengths:  Smart  Patient Identified Areas of Improvement:  Law  Patient Goal for Hospitalization:  "to not screw with medication anymore because they are working fine"  Current SI (including self-harm):  No  Current HI:  No  Current AVH: Yes (Hearing yelling from angels saying Hallejuah)  Staff Intervention Plan: Group Attendance,Collaborate with Interdisciplinary Treatment Team  Consent to Intern Participation: N/A     Caroll Rancher, LRT/CTRS   Lillia Abed, Shabana Armentrout A 11/13/2020, 11:41 AM
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
## 2020-11-13 NOTE — Progress Notes (Addendum)
Pt stated he was feeling ok this evening, pt seen pacing the unit some. Pt stated he had a good day. Pt given PRN Trazodone and Vistaril per MAR with HS medication    11/13/20 2000  Psych Admission Type (Psych Patients Only)  Admission Status Involuntary  Psychosocial Assessment  Patient Complaints Restlessness;Suspiciousness  Eye Contact Brief  Facial Expression Anxious  Affect Appropriate to circumstance  Speech Logical/coherent  Interaction Assertive  Motor Activity Slow;Pacing  Appearance/Hygiene In scrubs  Behavior Characteristics Cooperative  Mood Suspicious  Thought Process  Coherency Tangential;Circumstantial  Content Blaming others;Preoccupation;Paranoia  Delusions Grandeur  Perception Derealization  Hallucination None reported or observed  Judgment Poor  Confusion None  Danger to Self  Current suicidal ideation? Denies  Danger to Others  Danger to Others None reported or observed

## 2020-11-13 NOTE — Progress Notes (Signed)
Pt stated he takes allopurinol for his gout, wanted a multivitamin , and wants liquid Depakote. Pt encouraged to talk to the doctor

## 2020-11-13 NOTE — Progress Notes (Signed)
   11/13/20 1500  Psych Admission Type (Psych Patients Only)  Admission Status Involuntary  Psychosocial Assessment  Patient Complaints Suspiciousness  Eye Contact Brief  Facial Expression Anxious  Affect Appropriate to circumstance  Speech Logical/coherent  Interaction Assertive  Motor Activity Slow  Appearance/Hygiene In scrubs  Behavior Characteristics Cooperative  Mood Preoccupied;Suspicious  Thought Process  Coherency Tangential;Circumstantial  Content Blaming others;Preoccupation;Paranoia  Delusions Grandeur  Perception Derealization  Hallucination None reported or observed  Judgment Poor  Confusion None  Danger to Self  Current suicidal ideation? Denies  Danger to Others  Danger to Others None reported or observed
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
## 2020-11-13 NOTE — Progress Notes (Signed)
Recreation Therapy Notes  Date: 4.8.22 Time: 1000 Location: 500 Hall Dayroom   Group Topic: Communication, Team Building, Problem Solving  Goal Area(s) Addresses:  Patient will effectively work with peer towards shared goal.  Patient will identify skills used to make activity successful.  Patient will share challenges and verbalize solution-driven approaches used. Patient will identify how skills used during activity can be used to reach post d/c goals.   Behavioral Response: Attentive  Intervention: STEM Activity   Activity: [employer]. Patients were provided the following materials: 5 drinking straws, 5 rubber bands, 5 paper clips, 2 index cards and 2 drinking cups. Using the provided materials patients were asked to build a launching mechanism to launch a ping pong ball across the room, approximately 10 feet. Patients were divided into teams of 3-5. Instructions required all materials be incorporated into the device, functionality of items left to the peer group's discretion.  Education: Pharmacist, community, Scientist, physiological, Air cabin crew, Building control surveyor.   Education Outcome: Acknowledges education/In group clarification offered/Needs additional education.   Clinical Observations/Feedback:  Pt was social with peer but did more observing than anything else.  Pt expressed his partner was doing a good job Advice worker together.    Caroll Rancher, LRT/CTRS    Lillia Abed, [place] A 11/13/2020 11:15 AM

## 2020-11-13 NOTE — [facility] Group Notes (Signed)
SPIRITUALITY GROUP NOTE   Spirituality group facilitated by Kathleen Argue, BCC.   Group Description: Group focused on topic of hope. Patients participated in facilitated discussion around topic, connecting with one another around experiences and definitions for hope. Group members engaged with visual explorer photos, reflecting on what hope looks like for them today. Group engaged in discussion around how their definitions of hope are present today in hospital.   Modalities: Psycho-social ed, Adlerian, Narrative, MI   Patient Progress: Gordon Martin attended and participated in group.  He made comments that contributed to the conversation and also made comments that were off topic and not appropriate for group.  Chaplain Dyanne Carrel, Bcc Pager, [phone] Office: [phone] 4:40 PM

## 2020-11-13 NOTE — [facility] Group Notes (Signed)
[facility] LCSW Group Therapy  11/13/2020 11:56 AM  Type of Therapy:  Group Therapy: Strengths Exploration  Participation Level:  Active  Participation Quality:  Monopolizing  Affect:  Excited  Cognitive:  Lacking  Insight:  Distracting and Poor  Engagement in Therapy:  Distracting and Off Topic  Modes of Intervention:  Activity and Discussion  Summary of Progress/Problems: Pt attended and participated in group. Pt often shared stories that were off topic and not appropriate for group discussion.   Kysa Calais A Davian Wollenberg 11/13/2020, 11:56 AM

## 2020-11-13 NOTE — Tx Team (Signed)
Interdisciplinary Treatment and Diagnostic Plan Update  11/13/2020 Time of Session: 9:55am Gordon Martin MRN: [id number]  Principal Diagnosis: <principal problem not specified>  Secondary Diagnoses: Active Problems:   Schizoaffective disorder, bipolar type ([place])   Current Medications:  Current Facility-Administered Medications  Medication Dose Route Frequency Provider Last Rate Last Admin  . acetaminophen (TYLENOL) tablet 650 mg  650 mg Oral Q6H PRN Sharma Covert, MD      . alum & mag hydroxide-simeth (MAALOX/MYLANTA) 200-200-20 MG/5ML suspension 30 mL  30 mL Oral Q4H PRN Sharma Covert, MD      . divalproex (DEPAKOTE) DR tablet 750 mg  750 mg Oral BID AC Sharma Covert, MD      . feeding supplement (ENSURE ENLIVE / ENSURE PLUS) liquid 237 mL  237 mL Oral BID BM Sharma Covert, MD   237 mL at 11/13/20 1203  . furosemide (LASIX) tablet 40 mg  40 mg Oral Daily Sharma Covert, MD   40 mg at 11/13/20 0815  . hydrOXYzine (ATARAX/VISTARIL) tablet 50 mg  50 mg Oral TID PRN Sharma Covert, MD   50 mg at 11/12/20 2109  . ziprasidone (GEODON) injection 20 mg  20 mg Intramuscular Q6H PRN Sharma Covert, MD       And  . LORazepam (ATIVAN) tablet 2 mg  2 mg Oral Q6H PRN Sharma Covert, MD      . magnesium hydroxide (MILK OF MAGNESIA) suspension 30 mL  30 mL Oral Daily PRN Sharma Covert, MD      . multivitamin with minerals tablet 1 tablet  1 tablet Oral Daily Sharma Covert, MD   1 tablet at 11/13/20 1202  . nicotine (NICODERM CQ - dosed in mg/24 hours) patch 14 mg  14 mg Transdermal Daily Sharma Covert, MD      . potassium chloride SA (KLOR-CON) CR tablet 20 mEq  20 mEq Oral Daily Sharma Covert, MD      . QUEtiapine (SEROQUEL) tablet 100 mg  100 mg Oral Q8H PRN Sharma Covert, MD      . QUEtiapine (SEROQUEL) tablet 400 mg  400 mg Oral QHS Sharma Covert, MD      . traZODone (DESYREL) tablet 100 mg  100 mg Oral QHS PRN Sharma Covert, MD    100 mg at 11/12/20 2109   PTA Medications: Medications Prior to Admission  Medication Sig Dispense Refill Last Dose  . divalproex (DEPAKOTE) 500 MG DR tablet Take 1 tablet (500 mg total) by mouth 2 (two) times daily before a meal. (Patient not taking: Reported on 09/09/2014) 1 tablet 0   . furosemide (LASIX) 40 MG tablet Take 1 tablet (40 mg total) by mouth daily. 15 tablet 0   . ibuprofen (ADVIL,MOTRIN) 800 MG tablet Take 800 mg by mouth every 8 (eight) hours as needed for moderate pain.     Marland Kitchen QUEtiapine (SEROQUEL) 200 MG tablet Take 1 tablet (200 mg total) by mouth at bedtime. (Patient not taking: Reported on 09/09/2014) 1 tablet 0   . traZODone (DESYREL) 100 MG tablet Take 100 mg by mouth at bedtime.       Patient Stressors: Financial difficulties Health problems Legal issue Marital or family conflict Medication change or noncompliance  Patient Strengths: Average or above average intelligence Capable of independent living Supportive family/friends  Treatment Modalities: Medication Management, Group therapy, Case management,  1 to 1 session with clinician, Psychoeducation, Recreational therapy.   Physician Treatment Plan  for Primary Diagnosis: <principal problem not specified> Long Term Goal(s): Improvement in symptoms so as ready for discharge Improvement in symptoms so as ready for discharge   Short Term Goals: Ability to identify changes in lifestyle to reduce recurrence of condition will improve Ability to verbalize feelings will improve Ability to disclose and discuss suicidal ideas Ability to demonstrate self-control will improve Ability to identify and develop effective coping behaviors will improve Ability to maintain clinical measurements within normal limits will improve Compliance with prescribed medications will improve Ability to identify changes in lifestyle to reduce recurrence of condition will improve Ability to verbalize feelings will improve Ability to  disclose and discuss suicidal ideas Ability to demonstrate self-control will improve Ability to identify and develop effective coping behaviors will improve Ability to maintain clinical measurements within normal limits will improve Compliance with prescribed medications will improve  Medication Management: Evaluate patient's response, side effects, and tolerance of medication regimen.  Therapeutic Interventions: 1 to 1 sessions, Unit Group sessions and Medication administration.  Evaluation of Outcomes: Not Met  Physician Treatment Plan for Secondary Diagnosis: Active Problems:   Schizoaffective disorder, bipolar type ([place])  Long Term Goal(s): Improvement in symptoms so as ready for discharge Improvement in symptoms so as ready for discharge   Short Term Goals: Ability to identify changes in lifestyle to reduce recurrence of condition will improve Ability to verbalize feelings will improve Ability to disclose and discuss suicidal ideas Ability to demonstrate self-control will improve Ability to identify and develop effective coping behaviors will improve Ability to maintain clinical measurements within normal limits will improve Compliance with prescribed medications will improve Ability to identify changes in lifestyle to reduce recurrence of condition will improve Ability to verbalize feelings will improve Ability to disclose and discuss suicidal ideas Ability to demonstrate self-control will improve Ability to identify and develop effective coping behaviors will improve Ability to maintain clinical measurements within normal limits will improve Compliance with prescribed medications will improve     Medication Management: Evaluate patient's response, side effects, and tolerance of medication regimen.  Therapeutic Interventions: 1 to 1 sessions, Unit Group sessions and Medication administration.  Evaluation of Outcomes: Not Met   RN Treatment Plan for Primary Diagnosis:  <principal problem not specified> Long Term Goal(s): Knowledge of disease and therapeutic regimen to maintain health will improve  Short Term Goals: Ability to participate in decision making will improve, Ability to verbalize feelings will improve and Ability to identify and develop effective coping behaviors will improve  Medication Management: RN will administer medications as ordered by provider, will assess and evaluate patient's response and provide education to patient for prescribed medication. RN will report any adverse and/or side effects to prescribing provider.  Therapeutic Interventions: 1 on 1 counseling sessions, Psychoeducation, Medication administration, Evaluate responses to treatment, Monitor vital signs and CBGs as ordered, Perform/monitor CIWA, COWS, AIMS and Fall Risk screenings as ordered, Perform wound care treatments as ordered.  Evaluation of Outcomes: Not Met   LCSW Treatment Plan for Primary Diagnosis: <principal problem not specified> Long Term Goal(s): Safe transition to appropriate next level of care at discharge, Engage patient in therapeutic group addressing interpersonal concerns.  Short Term Goals: Engage patient in aftercare planning with referrals and resources, Increase social support and Increase ability to appropriately verbalize feelings  Therapeutic Interventions: Assess for all discharge needs, 1 to 1 time with Social worker, Explore available resources and support systems, Assess for adequacy in community support network, Educate family and significant other(s) on suicide prevention,  Complete Psychosocial Assessment, Interpersonal group therapy.  Evaluation of Outcomes: Not Met   Progress in Treatment: Attending groups: Yes. Participating in groups: Yes. Taking medication as prescribed: Yes. Toleration medication: Yes. Family/Significant other contact made: No, will contact:  Pt's guardain Patient understands diagnosis: No. Discussing patient  identified problems/goals with staff: Yes. Medical problems stabilized or resolved: Yes. Denies suicidal/homicidal ideation: Yes. Issues/concerns per patient self-inventory: No. Other: None  New problem(s) identified: No, Describe:  None  New Short Term/Long Term Goal(s):medication stabilization, elimination of SI thoughts, development of comprehensive mental wellness plan.  Patient Goals:  "move into [place]."  Discharge Plan or Barriers: Patient recently admitted. CSW will continue to follow and assess for appropriate referrals and possible discharge planning.  Reason for Continuation of Hospitalization: Homicidal ideation Medication stabilization  Estimated Length of Stay: 3-5 days  Attendees: Patient: Gordon Martin 11/13/2020   Physician: Dr. Myles Lipps 11/13/2020   Nursing:  11/13/2020   RN Care Manager: 11/13/2020   Social Worker: Toney Reil, [place]  11/13/2020   Recreational Therapist:  11/13/2020   Other:  11/13/2020   Other:  11/13/2020   Other: 11/13/2020      Scribe for Treatment Team: Mliss Fritz, Latanya Presser 11/13/2020 2:52 PM

## 2020-11-13 NOTE — Progress Notes (Signed)
   11/13/20 0500  Sleep  Number of Hours 4.5

## 2020-11-14 DIAGNOSIS — F250 Schizoaffective disorder, bipolar type: Secondary | ICD-10-CM | POA: Diagnosis not present

## 2020-11-14 MED ORDER — QUETIAPINE FUMARATE 300 MG PO TABS
600.0000 mg | ORAL_TABLET | Freq: Every day | ORAL | Status: DC
Start: 2020-11-14 — End: 2020-11-16
  Administered 2020-11-14 – 2020-11-15 (×2): 600 mg via ORAL
  Filled 2020-11-14 (×3): qty 2

## 2020-11-14 MED ORDER — OMEGA-3-ACID ETHYL ESTERS 1 G PO CAPS
1.0000 g | ORAL_CAPSULE | Freq: Two times a day (BID) | ORAL | Status: DC
  Administered 2020-11-14 – 2020-12-01 (×34): 1 g via ORAL
  Filled 2020-11-14 (×25): qty 1
  Filled 2020-11-14 (×2): qty 10
  Filled 2020-11-14 (×13): qty 1

## 2020-11-14 MED ORDER — VITAMIN D3 25 MCG PO TABS
1000.0000 [IU] | ORAL_TABLET | Freq: Every day | ORAL | Status: DC
  Administered 2020-11-14 – 2020-12-01 (×18): 1000 [IU] via ORAL
  Filled 2020-11-14 (×11): qty 1
  Filled 2020-11-14: qty 5
  Filled 2020-11-14 (×8): qty 1

## 2020-11-14 MED ORDER — ALLOPURINOL 100 MG PO TABS
100.0000 mg | ORAL_TABLET | Freq: Two times a day (BID) | ORAL | Status: DC
  Administered 2020-11-14 – 2020-12-01 (×34): 100 mg via ORAL
  Filled 2020-11-14 (×4): qty 1
  Filled 2020-11-14: qty 10
  Filled 2020-11-14 (×12): qty 1
  Filled 2020-11-14: qty 10
  Filled 2020-11-14 (×20): qty 1
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
## 2020-11-14 NOTE — Progress Notes (Signed)
   11/14/20 0500  Sleep  Number of Hours 7.25

## 2020-11-14 NOTE — Progress Notes (Signed)
Adult Psychoeducational Group Note  Date:  11/14/2020 Time:  9:00 PM  Group Topic/Focus:  Wrap-Up Group:   The focus of this group is to help patients review their daily goal of treatment and discuss progress on daily workbooks.  Participation Level:  Minimal  Participation Quality:  Appropriate  Affect:  Anxious  Cognitive:  Appropriate  Insight: Improving  Engagement in Group:  Improving  Modes of Intervention:  Discussion  Additional Comments:  Pt stated he was doing better, pt felt his time here is helping  Delos Haring 11/14/2020, 9:00 PM

## 2020-11-14 NOTE — Progress Notes (Signed)
   11/14/20 2212  COVID-19 Daily Checkoff  Have you had a fever (temp > 37.80C/100F)  in the past 24 hours?  No  If you have had runny nose, nasal congestion, sneezing in the past 24 hours, has it worsened? No  COVID-19 EXPOSURE  Have you traveled outside the state in the past 14 days? No  Have you been in contact with someone with a confirmed diagnosis of COVID-19 or PUI in the past 14 days without wearing appropriate PPE? No  Have you been living in the same home as a person with confirmed diagnosis of COVID-19 or a PUI (household contact)? No  Have you been diagnosed with COVID-19? No

## 2020-11-14 NOTE — Progress Notes (Signed)
Pt up in his room pacing pt encouraged to try to lay down and get some rest.

## 2020-11-14 NOTE — Progress Notes (Signed)
   11/14/20 0900  Psych Admission Type (Psych Patients Only)  Admission Status Involuntary  Psychosocial Assessment  Patient Complaints Suspiciousness  Eye Contact Brief  Facial Expression Anxious  Affect Appropriate to circumstance  Speech Logical/coherent  Interaction Assertive  Motor Activity Slow;Pacing  Appearance/Hygiene In scrubs  Behavior Characteristics Cooperative  Mood Preoccupied  Thought Process  Coherency Tangential;Circumstantial  Content Blaming others;Preoccupation;Paranoia  Delusions Grandeur  Perception Derealization  Hallucination None reported or observed  Judgment Poor  Confusion None  Danger to Self  Current suicidal ideation? Denies  Danger to Others  Danger to Others None reported or observed

## 2020-11-14 NOTE — [facility] Group Notes (Signed)
[facility] Group Notes:  (Nursing/MHT/Case Management/Adjunct)  Date:  11/14/2020  Time:  10:45 AM  Type of Therapy:  Group Therapy  Participation Level:  Active  Participation Quality:  Appropriate  Affect:  Appropriate  Cognitive:  Alert and Appropriate  Insight:  Appropriate and Good  Engagement in Group:  Engaged  Modes of Intervention:  Orientation  Summary of Progress/Problems: Pt goal for today was to be able to use coping skills and make sure medications are doing their job.   Amedee Cerrone J Zeinab Rodwell 11/14/2020, 10:45 AM

## 2020-11-15 DIAGNOSIS — F250 Schizoaffective disorder, bipolar type: Secondary | ICD-10-CM | POA: Diagnosis not present

## 2020-11-15 NOTE — [facility] Group Notes (Signed)
Psychoeducational Group Note  Date: 11-15-20 Time:  1300  Group Topic/Focus:  Making Healthy Choices:   The focus of this group is to help patients identify negative/unhealthy choices they were using prior to admission and identify positive/healthier coping strategies to replace them upon discharge.In this group, patients started asking about the brain and how the brain works with and how the chemicals work for those who use substances, the pros and cons of saboxone.  Participation Level:  Active  Participation Quality:  Appropriate  Affect:  Appropriate  Cognitive:  Oriented  Insight:  Improving  Engagement in Group:  Engaged  Additional Comments: Pt was able to list sveral positives about himself and sharet his with the group   Gordon Martin A

## 2020-11-15 NOTE — B[facility] Group Notes (Signed)
[facility] LCSW Group Therapy Note  Date/Time:  11/15/2020  11:00AM-12:00PM  Type of Therapy and Topic:  Group Therapy:  Music and Mood  Participation Level:  Active   Description of Group: In this process group, members listened to a variety of genres of music and identified that different types of music evoke different responses.  Patients were encouraged to identify music that was soothing for them and music that was energizing for them.  Patients discussed how this knowledge can help with wellness and recovery in various ways including managing depression and anxiety as well as encouraging healthy sleep habits.    Therapeutic Goals: Patients will explore the impact of different varieties of music on mood Patients will verbalize the thoughts they have when listening to different types of music Patients will identify music that is soothing to them as well as music that is energizing to them Patients will discuss how to use this knowledge to assist in maintaining wellness and recovery Patients will explore the use of music as a coping skill  Summary of Patient Progress:  At the beginning of group, patient expressed that he felt "on top of the world."  He related this to his discharge plan being in process.  He listened attentively and responded when asked questions about his feelings related to different songs.  He was analytical in his answers, and he frequently talked about the human body having 13 frequencies, with "432" being the one in which he immediately goes to sleep.   At the end of group, patient expressed that he still felt on top of the world.    Therapeutic Modalities: Solution Focused Brief Therapy Activity   Ambrose Mantle, LCSW

## 2020-11-15 NOTE — Progress Notes (Signed)
[facility] MD Progress Note  11/15/2020 2:51 PM Gordon Martin  MRN:  [id number] Subjective:  Patient is a 55 year old male with a reported past psychiatric history significant for schizoaffective disorder; bipolar type who was brought to the [facility] emergency department on 11/11/2020 under involuntary commitment. It was noted in the involuntary commitment paperwork that he had a history of violent behavior, and had not been taking his medications.  Objective: Patient is seen and examined.  Patient is a 55 year old male with the above-stated past psychiatric history who is seen in follow-up.  He continues to slowly improve.  He is not as pressured or tangential today.  He continues with his delusional thinking.  We did discuss discharge today.  I told him that we would need to talk to [place] to see if he actually had a place available there when he got discharged and whether they were willing to take him.  I told him that before we were able to discharge him to that facility we would need to know that he had a place to go to.  Shortly after that he contacted social work to find alternative housing.  His sleep is done well.  He denied any auditory or visual hallucinations.  He denied any suicidal or homicidal ideation.  His vital signs are stable, he is afebrile.  No new laboratories.  His vital signs are stable, he is afebrile.  Principal Problem: <principal problem not specified> Diagnosis: Active Problems:   Schizoaffective disorder, bipolar type (HCC)  Total Time spent with patient: 15 minutes  Past Psychiatric History: See admission H&P  Past Medical History:  Past Medical History:  Diagnosis Date  . Bipolar disorder (HCC)   . Gout    History reviewed. No pertinent surgical history. Family History: History reviewed. No pertinent family history. Family Psychiatric  History: See admission H&P Social History:  Social History   Substance and Sexual Activity  Alcohol  Use No     Social History   Substance and Sexual Activity  Drug Use No    Social History   Socioeconomic History  . Marital status: Married    Spouse name: Not on file  . Number of children: Not on file  . Years of education: Not on file  . Highest education level: Not on file  Occupational History  . Not on file  Tobacco Use  . Smoking status: Never Smoker  . Smokeless tobacco: Never Used  Substance and Sexual Activity  . Alcohol use: No  . Drug use: No  . Sexual activity: Not on file  Other Topics Concern  . Not on file  Social History Narrative  . Not on file   Social Determinants of Health   Financial Resource Strain: Not on file  Food Insecurity: Not on file  Transportation Needs: Not on file  Physical Activity: Not on file  Stress: Not on file  Social Connections: Not on file   Additional Social History:                         Sleep: Good  Appetite:  Good  Current Medications: Current Facility-Administered Medications  Medication Dose Route Frequency Provider Last Rate Last Admin  . acetaminophen (TYLENOL) tablet 650 mg  650 mg Oral Q6H PRN [name], MD      . allopurinol (ZYLOPRIM) tablet 100 mg  100 mg Oral BID [name], MD   100 mg at 11/15/20 0935  .  alum & mag hydroxide-simeth (MAALOX/MYLANTA) 200-200-20 MG/5ML suspension 30 mL  30 mL Oral Q4H PRN [name], MD      . divalproex (DEPAKOTE) DR tablet 750 mg  750 mg Oral BID AC [name], MD   750 mg at 11/15/20 7371  . feeding supplement (ENSURE ENLIVE / ENSURE PLUS) liquid 237 mL  237 mL Oral BID BM [name], MD   237 mL at 11/15/20 1434  . furosemide (LASIX) tablet 40 mg  40 mg Oral Daily [name], MD   40 mg at 11/15/20 0935  . hydrOXYzine (ATARAX/VISTARIL) tablet 50 mg  50 mg Oral TID PRN [name], MD   50 mg at 11/14/20 2150  . ziprasidone (GEODON) injection 20 mg  20 mg Intramuscular Q6H PRN [name], MD        And  . LORazepam (ATIVAN) tablet 2 mg  2 mg Oral Q6H PRN [name], MD      . magnesium hydroxide (MILK OF MAGNESIA) suspension 30 mL  30 mL Oral Daily PRN [name], MD      . multivitamin with minerals tablet 1 tablet  1 tablet Oral Daily [name], MD   1 tablet at 11/15/20 0934  . nicotine (NICODERM CQ - dosed in mg/24 hours) patch 14 mg  14 mg Transdermal Daily [name], MD      . omega-3 acid ethyl esters (LOVAZA) capsule 1 g  1 g Oral BID [name], MD   1 g at 11/15/20 0934  . potassium chloride SA (KLOR-CON) CR tablet 20 mEq  20 mEq Oral Daily [name], MD   20 mEq at 11/15/20 0935  . QUEtiapine (SEROQUEL) tablet 600 mg  600 mg Oral QHS [name], MD   600 mg at 11/14/20 2150  . traZODone (DESYREL) tablet 100 mg  100 mg Oral QHS PRN [name], MD   100 mg at 11/14/20 2150  . Vitamin D3 (Vitamin D) tablet 1,000 Units  1,000 Units Oral Daily [name], MD   1,000 Units at 11/15/20 0626    Lab Results: No results found for this or any previous visit (from the past 48 hour(s)).  Blood Alcohol level:  Lab Results  Component Value Date   ETH <10 11/11/2020   ETH <5 09/11/2014    Metabolic Disorder Labs: No results found for: HGBA1C, MPG No results found for: PROLACTIN No results found for: CHOL, TRIG, HDL, CHOLHDL, VLDL, LDLCALC  Physical Findings: AIMS: Facial and Oral Movements Muscles of Facial Expression: None, normal Lips and Perioral Area: None, normal Jaw: None, normal Tongue: None, normal,Extremity Movements Upper (arms, wrists, hands, fingers): None, normal Lower (legs, knees, ankles, toes): None, normal, Trunk Movements Neck, shoulders, hips: None, normal, Overall Severity Severity of abnormal movements (highest score from questions above): None, normal Incapacitation due to abnormal movements: None, normal Patient's awareness of abnormal movements (rate only patient's report): No  Awareness, Dental Status Current problems with teeth and/or dentures?: No Does patient usually wear dentures?: No  CIWA:    COWS:     Musculoskeletal: Strength & Muscle Tone: within normal limits Gait & Station: normal Patient leans: N/A  Psychiatric Specialty Exam:  Presentation  General Appearance: Disheveled  Eye Contact:Fair  Speech:Normal Rate  Speech Volume:Normal  Handedness:Right   Mood and Affect  Mood:Euthymic  Affect:Congruent   Thought Process  Thought Processes:Disorganized  Descriptions of Associations:Tangential  Orientation:Full (Time,  Place and Person)  Thought Content:Illogical; Paranoid Ideation  History of Schizophrenia/Schizoaffective disorder:Yes  Duration of Psychotic Symptoms:Greater than six months  Hallucinations:Hallucinations: Auditory  Ideas of Reference:Delusions; Paranoia; Percusatory  Suicidal Thoughts:Suicidal Thoughts: No  Homicidal Thoughts:Homicidal Thoughts: No   Sensorium  Memory:Immediate Poor; Remote Good; Remote Poor  Judgment:Fair  Insight:Lacking   Executive Functions  Concentration:Fair  Attention Span:Fair  Recall:Fair  Fund of Knowledge:Fair  Language:Fair   Psychomotor Activity  Psychomotor Activity:Psychomotor Activity: Normal   Assets  Assets:Desire for Improvement; Resilience   Sleep  Sleep:Sleep: Good Number of Hours of Sleep: 8.5    Physical Exam: Physical Exam Vitals and nursing note reviewed.  HENT:     Head: Normocephalic and atraumatic.  Pulmonary:     Effort: Pulmonary effort is normal.  Neurological:     General: No focal deficit present.     Mental Status: He is alert and oriented to person, place, and time.    ROS Blood pressure 101/70, pulse 94, temperature 97.7 F (36.5 C), temperature source Oral, resp. rate 18, height 5\' 11"  (1.803 m), weight 88.5 kg, SpO2 100 %. Body mass index is 27.2 kg/m.   Treatment Plan Summary: Daily contact with patient to  assess and evaluate symptoms and progress in treatment, Medication management and Plan : Patient is seen and examined.  Patient is a 55 year old male with the above-stated past psychiatric history who is seen in follow-up.   Diagnosis: 1.  Schizoaffective disorder; bipolar type 2.  History of gout 3.  Hypertension 4.  History of vitamin D deficiency  Pertinent findings on examination today: 1.  Decrease sedation during the daytime. 2.  Sleep is improved. 3.  Decrease tangentiality and pressured speech. 4.  Delusional thinking is still present, and I think that is probably fixed. 5.  No suicidal or homicidal ideation.  Plan: 1.  Continue allopurinol 100 mg p.o. twice daily for gout. 2.  Continue Depakote DR 750 mg p.o. twice daily for mood stability. 3.  Continue Lasix 40 mg p.o. daily for hypertension. 4.  Continue hydroxyzine 50 mg p.o. 3 times daily as needed anxiety. 5.  Continue omega-3 fatty acids 1 g p.o. twice daily for neuro protection. 6.  Continue potassium chloride 20 mEq p.o. daily to replete potassium from diuretics. 7.  Continue Seroquel 600 mg p.o. nightly for mood stability and psychosis. 8.  Continue trazodone 100 mg p.o. nightly as needed insomnia. 9.  Continue trazodone 100 mg p.o. nightly as needed insomnia. 10.  Continue vitamin D 3000 units p.o. daily for nutritional supplementation. 10.  Continue Geodon/Ativan as needed agitation protocol. 12.  Disposition planning-in progress.  52, MD 11/15/2020, 2:51 PM

## 2020-11-15 NOTE — Progress Notes (Signed)
Patient was up briefly tonight. He was observed in the hallway pacing but has been pleasant. He was compliant with his medications.

## 2020-11-15 NOTE — Progress Notes (Signed)
Progress note    11/15/20 0935  Psych Admission Type (Psych Patients Only)  Admission Status Involuntary  Psychosocial Assessment  Patient Complaints None  Eye Contact Fair  Facial Expression Animated  Affect Appropriate to circumstance  Speech Logical/coherent  Interaction Assertive  Motor Activity Slow  Appearance/Hygiene Unremarkable  Behavior Characteristics Cooperative;Appropriate to situation  Mood Pleasant  Thought Process  Coherency WDL  Content Paranoia  Delusions Grandeur  Perception Derealization;Hallucinations  Hallucination Auditory  Judgment Poor  Confusion None  Danger to Self  Current suicidal ideation? Denies  Danger to Others  Danger to Others None reported or observed

## 2020-11-15 NOTE — Progress Notes (Signed)
Pt would like to speak to social worker regarding a second housing option they possibly could be discharged to

## 2020-11-15 NOTE — [facility] Group Notes (Addendum)
Pt attend this group anAdult Psychoeducational Group Not Date:  11/15/2020 Time:  0900-1045 Group Topic/Focus: PROGRESSIVE RELAXATION. A group where deep breathing is taught and tensing and relaxation muscle groups is used. Imagery is used as well.  Pts are asked to imagine 3 pillars that hold them up when they are not able to hold themselves up.  Participation Level:  participated   Participation Quality:  Appropriate  Affect:  Appropriate  Cognitive:  Oriented  Insight: Improving  Engagement in Group:  Engaged  Modes of Intervention:  Activity, Discussion, Education, and Support  Additional Comments:  Pt attended this group and was quit active in it. States that =he is very intelligent and often has trouble for recogniae his taletns since he is a "mental patient".  Dione Housekeeper

## 2020-11-15 NOTE — Progress Notes (Signed)
Writer spoke with patient 1:1 after he got up from resting. He reported having had a good day and was able to go outside which he enjoyed. He reports that his plan is to return to [place] but he would like a suite this time and not one of the tiny rooms some have. He spoke of his sister and how she took him to court making false accusations. He was in the dayroom briefly before requesting his medications. He has been pleasant tonight.

## 2020-11-15 NOTE — Progress Notes (Signed)
Adult Psychoeducational Group Note  Date:  11/15/2020 Time:  9:48 PM  Group Topic/Focus:  Wrap-Up Group:   The focus of this group is to help patients review their daily goal of treatment and discuss progress on daily workbooks.  Participation Level:  Minimal  Participation Quality:  Appropriate  Affect:  Appropriate  Cognitive:  Appropriate  Insight: Appropriate  Engagement in Group:  Limited  Modes of Intervention:  Discussion  Additional Comments:  Pt stated his goal for today was to focus on his treatment plan. Pt stated he accomplished his goal today. Pt stated he talked with his doctor and social worker about his care today. Pt rated his overall day a 10. Pt stated he made no calls today. Pt stated he felt better about himself today. Pt stated he was able to attend all meals. Pt stated he took all medications provided today. Pt stated his appetite was pretty good today. Pt rated sleep last night was pretty good. Pt stated the goal tonight was to get some rest. Pt stated he had no physical pain today.  Pt deny visual hallucinations or auditory issues tonight. Pt denies thoughts of harming himself or others. Pt stated he would alert staff if anything changed.  Felipa Furnace 11/15/2020, 9:48 PM

## 2020-11-15 NOTE — Plan of Care (Signed)
  Problem: Education: Goal: Knowledge of [facility] Education information/materials will improve Outcome: Progressing Goal: Emotional status will improve Outcome: Progressing Goal: Mental status will improve Outcome: Progressing Goal: Verbalization of understanding the information provided will improve Outcome: Progressing   

## 2020-11-16 DIAGNOSIS — F250 Schizoaffective disorder, bipolar type: Secondary | ICD-10-CM | POA: Diagnosis not present

## 2020-11-16 LAB — LIPID PANEL
Cholesterol: 159 mg/dL (ref 0–200)
HDL: 32 mg/dL — ABNORMAL LOW (ref >40)
LDL Cholesterol: 73 mg/dL (ref 0–99)
Total CHOL/HDL Ratio: 5 RATIO
Triglycerides: 269 mg/dL — ABNORMAL HIGH (ref <150)
VLDL: 54 mg/dL — ABNORMAL HIGH (ref 0–40)

## 2020-11-16 LAB — HEMOGLOBIN A1C
Hgb A1c MFr Bld: 5.2 % (ref 4.8–5.6)
Mean Plasma Glucose: 102.54 mg/dL

## 2020-11-16 LAB — BASIC METABOLIC PANEL
Anion gap: 11 (ref 5–15)
BUN: 28 mg/dL — ABNORMAL HIGH (ref 6–20)
CO2: 26 mmol/L (ref 22–32)
Calcium: 9.3 mg/dL (ref 8.9–10.3)
Chloride: 99 mmol/L (ref 98–111)
Creatinine, Ser: 1.36 mg/dL — ABNORMAL HIGH (ref 0.61–1.24)
GFR, Estimated: >60 mL/min (ref >60)
Glucose, Bld: 124 mg/dL — ABNORMAL HIGH (ref 70–99)
Potassium: 4.5 mmol/L (ref 3.5–5.1)
Sodium: 136 mmol/L (ref 135–145)

## 2020-11-16 MED ORDER — DIVALPROEX SODIUM 500 MG PO DR TAB
1000.0000 mg | DELAYED_RELEASE_TABLET | Freq: Every evening | ORAL | Status: DC
  Administered 2020-11-16 – 2020-11-23 (×8): 1000 mg via ORAL
  Filled 2020-11-16 (×11): qty 2

## 2020-11-16 MED ORDER — DOCUSATE SODIUM 100 MG PO CAPS
100.0000 mg | ORAL_CAPSULE | Freq: Every day | ORAL | Status: DC
  Administered 2020-11-16 – 2020-11-24 (×9): 100 mg via ORAL
  Filled 2020-11-16 (×12): qty 1

## 2020-11-16 MED ORDER — DIVALPROEX SODIUM 250 MG PO DR TAB
750.0000 mg | DELAYED_RELEASE_TABLET | Freq: Every day | ORAL | Status: DC
  Administered 2020-11-17 – 2020-11-24 (×8): 750 mg via ORAL
  Filled 2020-11-16 (×10): qty 3

## 2020-11-16 MED ORDER — QUETIAPINE FUMARATE 400 MG PO TABS
700.0000 mg | ORAL_TABLET | Freq: Every day | ORAL | Status: DC
  Administered 2020-11-16 – 2020-11-17 (×2): 700 mg via ORAL
  Filled 2020-11-16 (×4): qty 1

## 2020-11-16 MED ORDER — POLYETHYLENE GLYCOL 3350 17 G PO PACK
17.0000 g | PACK | Freq: Every day | ORAL | Status: DC
  Administered 2020-11-16 – 2020-11-24 (×9): 17 g via ORAL
  Filled 2020-11-16 (×12): qty 1

## 2020-11-16 NOTE — BHH Counselor (Signed)
CSW left a voicemail with Cortlin Marano [phone]) who is to assist with placement and discharge plans for this patient.    Ruthann Cancer MSW, LCSW Clincal Social Worker  [facility]

## 2020-11-16 NOTE — Progress Notes (Signed)
   11/15/20 2055  COVID-19 Daily Checkoff  Have you had a fever (temp > 37.80C/100F)  in the past 24 hours?  No  If you have had runny nose, nasal congestion, sneezing in the past 24 hours, has it worsened? No  COVID-19 EXPOSURE  Have you traveled outside the state in the past 14 days? No  Have you been in contact with someone with a confirmed diagnosis of COVID-19 or PUI in the past 14 days without wearing appropriate PPE? No  Have you been living in the same home as a person with confirmed diagnosis of COVID-19 or a PUI (household contact)? No  Have you been diagnosed with COVID-19? No   

## 2020-11-16 NOTE — Progress Notes (Signed)
Recreation Therapy Notes  Date: 4.11.22 Time: 0174-9449 Location: [address] Dayroom  Group Topic: Coping Skills  Goal Area(s) Addresses:  Patient will identify positive coping skills. Patient will identify benefit of using coping skills.  Behavioral Response:  Engaged  Intervention: Worksheet, Pencils  Activity: Mind Map.  LRT and patients filled out the first eight boxes (communication, anxiety, mania, depression, dreams, stress, lack of sleep and hunger) of the mind map together. Patients were then given time to come up with coping skills for each circumstance.  Patients would then come back together as a group and LRT would write the coping skills on the board.  Education: Pharmacologist, Building control surveyor.   Education Outcome: Acknowledges understanding/In group clarification offered/Needs additional education.   Clinical Observations/Feedback: Pt was social with peers and in explaining things they didn't understand.  Pt was attentive to the activity.  Pt identified some coping skills as music, CBD oil, exercise, reading, diet, talk to some one , sugar and a meal with friends.    Caroll Rancher, LRT/CTRS     Lillia Abed, Matisyn Cabeza A 11/16/2020 11:15 AM

## 2020-11-16 NOTE — Progress Notes (Signed)
Pt stated he was doing better, pt visible on the unit some this evening. Pt stated he was sleeping at night     11/16/20 2300  Psych Admission Type (Psych Patients Only)  Admission Status Involuntary  Psychosocial Assessment  Patient Complaints None  Eye Contact Fair  Facial Expression Animated  Affect Appropriate to circumstance  Speech Logical/coherent  Interaction Assertive  Motor Activity Slow  Appearance/Hygiene Unremarkable  Behavior Characteristics Cooperative  Mood Suspicious;Preoccupied  Thought Process  Coherency WDL  Content Paranoia  Delusions Grandeur  Perception Derealization;Hallucinations  Hallucination Auditory  Judgment Poor  Confusion None  Danger to Self  Current suicidal ideation? Denies  Danger to Others  Danger to Others None reported or observed

## 2020-11-16 NOTE — [facility] Suicide Risk Assessment (Signed)
[facility] INPATIENT:  Family/Significant Other Suicide Prevention Education  Suicide Prevention Education: Education Completed; Minerva Areola [phone]; [phone]) has been identified by the patient as the family member/significant other with whom the patient will be residing, and identified as the person(s) who will aid the patient in the event of a mental health crisis (suicidal ideations/suicide attempt).  With written consent from the patient, the family member/significant other has been provided the following suicide prevention education, prior to the and/or following the discharge of the patient.  The suicide prevention education provided includes the following:  Suicide risk factors  Suicide prevention and interventions  National Suicide Hotline telephone number  [facility] assessment telephone number  [facility] Emergency Assistance 911  [facility] and/or Residential Mobile Crisis Unit telephone number   Request made of family/significant other to:  Remove weapons (e.g., guns, rifles, knives), all items previously/currently identified as safety concern.    Remove drugs/medications (over-the-counter, prescriptions, illicit drugs), all items previously/currently identified as a safety concern.   The family member/significant other verbalizes understanding of the suicide prevention education information provided.  The family member/significant other agrees to remove the items of safety concern listed above.  Per Ms. Monroe this patient has been off of his medications for some time and has become increasingly psychotic. Per Ms. Monroe this patient had been staying in an apartment that was supervised by a group home. She states he is no longer allowed to return due to setting up trip wires and traps in his apartment.   Ms. Archie Patten states that this patient does not have any housing with [facility].  Ms. Archie Patten states that she has placement for this  patient in [place] and has requested CSW to coordinate with Carlota Raspberry, [phone] for coordinating his discharge.    Ruthann Cancer MSW, LCSW Clincal Social Worker  [facility]

## 2020-11-16 NOTE — Progress Notes (Addendum)
Pt observed to be tangential, hyperactive, pacing in hall at intervals and changing his clothes (layered) multiple times this shift. Noted to be delusional, grandeur in nature "I'm a Archivist. I investigated my apartment complex and caught them in a shady deal with tow co-orperations. I guess I did something good. I'm bored just sitting in here and now not investigating". Rates his anxiety, depression and hopelessness all 0/10. Reports he slept well last night with good appetite, high energy and good concentration level. Pt attended and participated in shedule groups on and off unit. Remains medication compliant. Denies adverse drug reactions when assessed. Tolerates meals well. Continued support and encouragement offered to pt. Q 15 minutes safety checks maintained without self harm gestures or outburst thus far.

## 2020-11-16 NOTE — Progress Notes (Signed)
[facility] MD Progress Note  11/16/2020 11:12 AM TANAY MISURACA  MRN:  [id number] Subjective:  Patient is a 55 year old male with a reported past psychiatric history significant for schizoaffective disorder; bipolar type who was brought to the [facility] emergency department on 11/11/2020 under involuntary commitment. It was noted in the involuntary commitment paperwork that he had a history of violent behavior, and had not been taking his medications.  Objective: Patient is seen and examined.  Patient is a 55 year old male with the above-stated past psychiatric history who is seen in follow-up.  He was looking better yesterday, but today is back to being labile, paranoid.  He is on the telephone paranoid and telling whoever is speaking to not to give information with regard [facility].  He still continues to talk about [place] and getting into the suite that his dad mother lived in.  He is pressured and tangential.  We had cut back on his daytime Seroquel yesterday because of oversedation.  He also complains of constipation.  Nursing brought me that issue and we have already written for Colace as well as MiraLAX.  His vital signs are stable, he is afebrile.  Pulse oximetry on room air was 96%.  He slept 6.75 hours last night.  Valproic acid level from 4/7 was 70.  He denied suicidal or homicidal ideation.  He denied any auditory or visual hallucinations.  Principal Problem: <principal problem not specified> Diagnosis: Active Problems:   Schizoaffective disorder, bipolar type (HCC)  Total Time spent with patient: 20 minutes  Past Psychiatric History: See admission H&P  Past Medical History:  Past Medical History:  Diagnosis Date  . Bipolar disorder (HCC)   . Gout    History reviewed. No pertinent surgical history. Family History: History reviewed. No pertinent family history. Family Psychiatric  History: See  admission H&P Social History:  Social History   Substance and Sexual Activity  Alcohol Use No     Social History   Substance and Sexual Activity  Drug Use No    Social History   Socioeconomic History  . Marital status: Married    Spouse name: Not on file  . Number of children: Not on file  . Years of education: Not on file  . Highest education level: Not on file  Occupational History  . Not on file  Tobacco Use  . Smoking status: Never Smoker  . Smokeless tobacco: Never Used  Substance and Sexual Activity  . Alcohol use: No  . Drug use: No  . Sexual activity: Not on file  Other Topics Concern  . Not on file  Social History Narrative  . Not on file   Social Determinants of Health   Financial Resource Strain: Not on file  Food Insecurity: Not on file  Transportation Needs: Not on file  Physical Activity: Not on file  Stress: Not on file  Social Connections: Not on file   Additional Social History:                         Sleep: Fair  Appetite:  Good  Current Medications: Current Facility-Administered Medications  Medication Dose Route Frequency Provider Last Rate Last Admin  . acetaminophen (TYLENOL) tablet 650 mg  650 mg Oral Q6H PRN [name], MD      . allopurinol (ZYLOPRIM) tablet 100 mg  100 mg Oral BID [name], MD  100 mg at 11/16/20 0816  . alum & mag hydroxide-simeth (MAALOX/MYLANTA) 200-200-20 MG/5ML suspension 30 mL  30 mL Oral Q4H PRN [name], MD      . divalproex (DEPAKOTE) DR tablet 750 mg  750 mg Oral BID AC [name], MD   750 mg at 11/16/20 [phone]  . docusate sodium (COLACE) capsule 100 mg  100 mg Oral Daily [name], MD   100 mg at 11/16/20 0959  . feeding supplement (ENSURE ENLIVE / ENSURE PLUS) liquid 237 mL  237 mL Oral BID BM [name], MD   237 mL at 11/15/20 1434  . furosemide (LASIX) tablet 40 mg  40 mg Oral Daily [name], MD   40 mg at 11/16/20 0816  .  hydrOXYzine (ATARAX/VISTARIL) tablet 50 mg  50 mg Oral TID PRN [name], MD   50 mg at 11/15/20 2048  . ziprasidone (GEODON) injection 20 mg  20 mg Intramuscular Q6H PRN [name], MD       And  . LORazepam (ATIVAN) tablet 2 mg  2 mg Oral Q6H PRN [name], MD      . magnesium hydroxide (MILK OF MAGNESIA) suspension 30 mL  30 mL Oral Daily PRN [name], MD      . multivitamin with minerals tablet 1 tablet  1 tablet Oral Daily [name], MD   1 tablet at 11/16/20 0816  . nicotine (NICODERM CQ - dosed in mg/24 hours) patch 14 mg  14 mg Transdermal Daily [name], MD      . omega-3 acid ethyl esters (LOVAZA) capsule 1 g  1 g Oral BID [name], MD   1 g at 11/16/20 0816  . polyethylene glycol (MIRALAX / GLYCOLAX) packet 17 g  17 g Oral Daily [name], MD   17 g at 11/16/20 0959  . potassium chloride SA (KLOR-CON) CR tablet 20 mEq  20 mEq Oral Daily [name], MD   20 mEq at 11/16/20 0815  . QUEtiapine (SEROQUEL) tablet 600 mg  600 mg Oral QHS [name], MD   600 mg at 11/15/20 2047  . traZODone (DESYREL) tablet 100 mg  100 mg Oral QHS PRN [name], MD   100 mg at 11/15/20 2047  . Vitamin D3 (Vitamin D) tablet 1,000 Units  1,000 Units Oral Daily [name], MD   1,000 Units at 11/16/20 [phone]    Lab Results: No results found for this or any previous visit (from the past 48 hour(s)).  Blood Alcohol level:  Lab Results  Component Value Date   ETH <10 11/11/2020   ETH <5 09/11/2014    Metabolic Disorder Labs: No results found for: HGBA1C, MPG No results found for: PROLACTIN No results found for: CHOL, TRIG, HDL, CHOLHDL, VLDL, LDLCALC  Physical Findings: AIMS: Facial and Oral Movements Muscles of Facial Expression: None, normal Lips and Perioral Area: None, normal Jaw: None, normal Tongue: None, normal,Extremity Movements Upper (arms, wrists, hands, fingers): None, normal Lower  (legs, knees, ankles, toes): None, normal, Trunk Movements Neck, shoulders, hips: None, normal, Overall Severity Severity of abnormal movements (highest score from questions above): None, normal Incapacitation due to abnormal movements: None, normal Patient's awareness of abnormal movements (rate only patient's report): No Awareness, Dental Status Current problems with teeth and/or dentures?: No Does patient usually wear dentures?: No  CIWA:    COWS:     Musculoskeletal: Strength &  Muscle Tone: within normal limits Gait & Station: normal Patient leans: N/A  Psychiatric Specialty Exam:  Presentation  General Appearance: Disheveled  Eye Contact:Good  Speech:Pressured  Speech Volume:Increased  Handedness:Right   Mood and Affect  Mood:Labile  Affect:Labile   Thought Process  Thought Processes:Goal Directed  Descriptions of Associations:Tangential  Orientation:Full (Time, Place and Person)  Thought Content:Delusions; Paranoid Ideation; Tangential  History of Schizophrenia/Schizoaffective disorder:Yes  Duration of Psychotic Symptoms:Greater than six months  Hallucinations:Hallucinations: None  Ideas of Reference:Delusions; Paranoia  Suicidal Thoughts:Suicidal Thoughts: No  Homicidal Thoughts:Homicidal Thoughts: No   Sensorium  Memory:Immediate Fair; Recent Fair; Remote Fair  Judgment:Impaired  Insight:Shallow   Executive Functions  Concentration:Poor  Attention Span:Poor  Recall:Fair  Fund of Knowledge:Fair  Language:Fair   Psychomotor Activity  Psychomotor Activity:Psychomotor Activity: Increased; Restlessness   Assets  Assets:Desire for Improvement; Resilience   Sleep  Sleep:Sleep: Good Number of Hours of Sleep: 6.75    Physical Exam: Physical Exam Vitals and nursing note reviewed.  HENT:     Head: Normocephalic and atraumatic.  Pulmonary:     Effort: Pulmonary effort is normal.  Neurological:     General: No focal deficit  present.     Mental Status: He is alert and oriented to person, place, and time.    ROS Blood pressure 95/69, pulse 90, temperature 98.1 F (36.7 C), temperature source Oral, resp. rate 18, height 5\' 11"  (1.803 m), weight 88.5 kg, SpO2 96 %. Body mass index is 27.2 kg/m.   Treatment Plan Summary: Daily contact with patient to assess and evaluate symptoms and progress in treatment, Medication management and Plan : Patient is seen and examined.  Patient is a 55 year old male with the above-stated past psychiatric history who is seen in follow-up.  Diagnosis: 1. Schizoaffective disorder; bipolar type 2. History of gout 3. Hypertension 4. History of vitamin D deficiency 5.  Constipation  Pertinent findings on examination today: 1.  More labile today.  More tangential, more paranoid. 2.  Sleep is stable. 3.  Denies any suicidal or homicidal ideation, but is very paranoid about his sister and how she had "removed all my things out of my house".  Plan: 1.  Increase Depakote DR to 750 mg p.o. daily and at 1000 mg p.o. every afternoon for mood stability. 2.  Increase Seroquel to 700 mg p.o. nightly for mood stability and psychosis. 3.  Continue Lasix 40 mg p.o. daily for hypertension. 4.  Continue hydroxyzine 50 mg p.o. 3 times daily as needed anxiety. 5.  Continue omega-3 fatty acids 1 g p.o. twice daily for neuro protection. 6.  Continue potassium chloride 20 mEq p.o. daily to replete potassium loss from diuretics. 7.  Continue trazodone 100 mg p.o. nightly as needed insomnia. 8.  Continue vitamin D 3000 units p.o. daily for nutritional supplementation. 9.  Continue allopurinol 100 mg p.o. daily for gout prophylaxis. 10.  Start Colace 100 mg p.o. nightly for constipation. 11.  Start MiraLAX 17 g 8 ounces of water p.o. daily for constipation. 12.  Continue Geodon/Ativan as needed agitation protocol. 13.  We will also send [facility] well as the [facility]. 14.  Disposition planning-in progress. [facility], MD 11/16/2020, 11:12 AM

## 2020-11-16 NOTE — [facility] Group Notes (Signed)
LCSW Group Therapy Note  Type of Therapy/Topic: Group Therapy: Six Dimensions of Wellness  Participation Level: Active  Description of Group: This group will address the concept of wellness and the six concepts of wellness: occupational, physical, social, intellectual, spiritual, and emotional. Patients will be encouraged to process areas in their lives that are out of balance and identify reasons for remaining unbalanced. Patients will be encouraged to explore ways to practice healthy habits daily to attain better physical and mental health outcomes.  Therapeutic Goals: 1. Identify aspects of wellness that they are doing well. 2. Identify aspects of wellness that they would like to improve upon. 3. Identify one action they can take to improve an aspect of wellness in their lives.   Summary of Patient Progress: Gordon Martin shared that he likes to go on walks for exercise and he uses this to better his physical wellness.    Therapeutic Modalities: Cognitive Behavioral Therapy Solution-Focused Therapy Relapse Prevention   Ruthann Cancer MSW, LCSW Clincal Social Worker  [facility]

## 2020-11-16 NOTE — Progress Notes (Signed)
Adult Psychoeducational Group Note  Date:  11/16/2020 Time:  10:13 PM  Group Topic/Focus:  Wrap-Up Group:   The focus of this group is to help patients review their daily goal of treatment and discuss progress on daily workbooks.  Participation Level:  Did Not Attend  Participation Quality:  Did Not Attend  Affect:  Did Not Attend  Cognitive:  Did Not Attend  Insight: None  Engagement in Group:  Did Not Attend  Modes of Intervention:  Did Not Attend  Additional Comments:  Pt did not attend wrap up group tonight.  Felipa Furnace 11/16/2020, 10:13 PM

## 2020-11-16 NOTE — [facility] Group Notes (Signed)
[facility] Group Notes:  (Nursing/MHT/Case Management/Adjunct)  Date:  11/16/2020  Time:  2:11 PM  Type of Therapy:  Group Therapy  Participation Level:  Active  Participation Quality:  Appropriate  Affect:  Appropriate  Cognitive:  Alert and Appropriate  Insight:  Appropriate and Good  Engagement in Group:  Engaged  Modes of Intervention:  Orientation  Summary of Progress/Problems: Pt's goal today is to stay calm no matter what the situation might be.   Mahlani Berninger J Arthelia Callicott 11/16/2020, 2:11 PM
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
## 2020-11-17 DIAGNOSIS — F250 Schizoaffective disorder, bipolar type: Secondary | ICD-10-CM | POA: Diagnosis not present

## 2020-11-17 MED ORDER — ZIPRASIDONE MESYLATE 20 MG IM SOLR: 20.0000 mg | Freq: Two times a day (BID) | INTRAMUSCULAR | Status: DC | PRN

## 2020-11-17 MED ORDER — LORAZEPAM 1 MG PO TABS: 1.0000 mg | ORAL_TABLET | Freq: Four times a day (QID) | ORAL | Status: DC | PRN

## 2020-11-17 MED ORDER — LORAZEPAM 2 MG/ML IJ SOLN: 1.0000 mg | Freq: Four times a day (QID) | INTRAMUSCULAR | Status: DC | PRN

## 2020-11-17 NOTE — Progress Notes (Signed)
Adult Psychoeducational Group Note  Date:  11/17/2020 Time:  11:05 PM  Group Topic/Focus:  Wrap-Up Group:   The focus of this group is to help patients review their daily goal of treatment and discuss progress on daily workbooks.  Participation Level:  Active  Participation Quality:  Appropriate  Affect:  Appropriate and Irritable  Cognitive:  Appropriate  Insight: Appropriate  Engagement in Group:  Developing/Improving  Modes of Intervention:  Discussion  Additional Comments: Pt stated his goal for today was to focus on his treatment plan. Pt stated he accomplished his goal today. Pt stated he talked with his doctor and social worker about his care today. Pt rated his overall day a 5 out of 10. Pt stated he is ready to be discharged. Pt stated he did not know why it is taken so long. Pt stated he made no calls today. Pt stated he felt better about himself today. Pt stated he was able to attend all meals. Pt stated he took all medications provided today. Pt stated he attend all groups held today.  Pt stated his appetite was pretty good today. Pt rated sleep last night was pretty good. Pt stated the goal tonight was to get some rest. Pt stated he had no physical pain today.  Pt admitted to dealing with visual hallucinations and auditory issues tonight. Pt nurse was updated on situation.  Pt denies thoughts of harming himself or others. Pt stated he would alert staff if anything changed.   Felipa Furnace 11/17/2020, 11:05 PM

## 2020-11-17 NOTE — B[facility] Group Notes (Signed)
B[facility] Group Notes:  (Nursing/MHT/Case Management/Adjunct)  Date:  11/17/2020  Time:  4:55 PM  Type of Therapy:  Group Therapy  Participation Level:  Active  Participation Quality:  Appropriate  Affect:  Appropriate  Cognitive:  Alert and Appropriate  Insight:  Appropriate  Engagement in Group:  Engaged  Modes of Intervention:  Orientation  Summary of Progress/Problems: Pt goal today was to use coping skills and try to get over to the [place] because that is where he wants to be.   Demiah Gullickson J Hailie Searight 11/17/2020, 4:55 PM
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
## 2020-11-17 NOTE — Plan of Care (Signed)
  Problem: Coping: Goal: Coping ability will improve Outcome: Progressing Goal: Will verbalize feelings Outcome: Progressing   Problem: Health Behavior/Discharge Planning: Goal: Compliance with prescribed medication regimen will improve Outcome: Progressing   

## 2020-11-17 NOTE — Progress Notes (Signed)
   11/17/20 0500  Sleep  Number of Hours 7.75

## 2020-11-17 NOTE — Progress Notes (Signed)
Progress note    11/17/20 0746  Psych Admission Type (Psych Patients Only)  Admission Status Involuntary  Psychosocial Assessment  Patient Complaints Agitation;Anxiety  Eye Contact Fair  Facial Expression Anxious;Pensive  Affect Irritable  Speech Logical/coherent  Interaction Assertive  Motor Activity Pacing;Slow  Appearance/Hygiene Unremarkable  Behavior Characteristics Cooperative;Appropriate to situation;Anxious  Mood Anxious;Irritable;Pleasant  Thought Process  Coherency Concrete thinking  Content Blaming others  Delusions Grandeur  Perception Derealization  Hallucination None reported or observed  Judgment Poor  Confusion None  Danger to Self  Current suicidal ideation? Denies  Danger to Others  Danger to Others None reported or observed

## 2020-11-17 NOTE — BHH Counselor (Signed)
CSW spoke with this patients care coordinator,  Mansfield Dann [phone]) to discuss discharge plans for this patient.   Per Mr. Zachery Dakins he had found a facility who originally denied this patient due to him coming straight from being in jail, not being on medications, and making threats. This facility is agreeable to reevaluate this patient since he has been restarted on medications.  Per Mr. Zachery Dakins if this placement falls through he has additional placement options that he will be working on.     Ruthann Cancer MSW, LCSW Clincal Social Worker  [facility]

## 2020-11-18 LAB — LIPID PANEL
Cholesterol: 167 mg/dL (ref 0–200)
HDL: 33 mg/dL — ABNORMAL LOW
LDL Cholesterol: 88 mg/dL (ref 0–99)
Total CHOL/HDL Ratio: 5.1 ratio
Triglycerides: 230 mg/dL — ABNORMAL HIGH
VLDL: 46 mg/dL — ABNORMAL HIGH (ref 0–40)

## 2020-11-18 LAB — CBC WITH DIFFERENTIAL/PLATELET
Abs Immature Granulocytes: 0.04 10*3/uL (ref 0.00–0.07)
Basophils Absolute: 0.1 10*3/uL (ref 0.0–0.1)
Basophils Relative: 2 %
Eosinophils Absolute: 0.1 10*3/uL (ref 0.0–0.5)
Eosinophils Relative: 2 %
HCT: 40.4 % (ref 39.0–52.0)
Hemoglobin: 14 g/dL (ref 13.0–17.0)
Immature Granulocytes: 1 %
Lymphocytes Relative: 39 %
Lymphs Abs: 1.6 10*3/uL (ref 0.7–4.0)
MCH: 33 pg (ref 26.0–34.0)
MCHC: 34.7 g/dL (ref 30.0–36.0)
MCV: 95.3 fL (ref 80.0–100.0)
Monocytes Absolute: 0.4 10*3/uL (ref 0.1–1.0)
Monocytes Relative: 11 %
Neutro Abs: 1.9 10*3/uL (ref 1.7–7.7)
Neutrophils Relative %: 45 %
Platelets: 126 10*3/uL — ABNORMAL LOW (ref 150–400)
RBC: 4.24 MIL/uL (ref 4.22–5.81)
RDW: 13.6 % (ref 11.5–15.5)
WBC: 4 10*3/uL (ref 4.0–10.5)
nRBC: 0 % (ref 0.0–0.2)

## 2020-11-18 LAB — COMPREHENSIVE METABOLIC PANEL
ALT: 22 U/L (ref 0–44)
AST: 25 U/L (ref 15–41)
Albumin: 3.8 g/dL (ref 3.5–5.0)
Alkaline Phosphatase: 63 U/L (ref 38–126)
Anion gap: 11 (ref 5–15)
BUN: 29 mg/dL — ABNORMAL HIGH (ref 6–20)
CO2: 23 mmol/L (ref 22–32)
Calcium: 8.9 mg/dL (ref 8.9–10.3)
Chloride: 103 mmol/L (ref 98–111)
Creatinine, Ser: 1.06 mg/dL (ref 0.61–1.24)
GFR, Estimated: >60 mL/min (ref >60)
Glucose, Bld: 92 mg/dL (ref 70–99)
Potassium: 4.3 mmol/L (ref 3.5–5.1)
Sodium: 137 mmol/L (ref 135–145)
Total Bilirubin: 0.6 mg/dL (ref 0.3–1.2)
Total Protein: 6.4 g/dL — ABNORMAL LOW (ref 6.5–8.1)

## 2020-11-18 LAB — HEMOGLOBIN A1C
Hgb A1c MFr Bld: 5.3 % (ref 4.8–5.6)
Mean Plasma Glucose: 105.41 mg/dL

## 2020-11-18 LAB — VALPROIC ACID LEVEL: Valproic Acid Lvl: 72 ug/mL (ref 50.0–100.0)

## 2020-11-18 LAB — TSH: TSH: 3.349 u[IU]/mL (ref 0.350–4.500)

## 2020-11-18 MED ORDER — BIOTENE DRY MOUTH MT LIQD
15.0000 mL | OROMUCOSAL | Status: DC | PRN
  Administered 2020-11-27 – 2020-12-01 (×2): 15 mL via OROMUCOSAL
  Filled 2020-11-18: qty 15

## 2020-11-18 MED ORDER — QUETIAPINE FUMARATE 400 MG PO TABS
800.0000 mg | ORAL_TABLET | Freq: Every day | ORAL | Status: DC
Start: 2020-11-18 — End: 2020-12-01
  Administered 2020-11-18 – 2020-11-30 (×13): 800 mg via ORAL
  Filled 2020-11-18: qty 2
  Filled 2020-11-18: qty 10
  Filled 2020-11-18 (×13): qty 2

## 2020-11-18 MED ORDER — MAGNESIUM CITRATE PO SOLN: 0.5000 | Freq: Once | ORAL | Status: DC | PRN
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
## 2020-11-18 NOTE — Plan of Care (Signed)
Called patient's guardian to provide clinical updates but no answer and voicemail full.   [name], MD, Celene Skeen

## 2020-11-18 NOTE — B[facility] Group Notes (Signed)
LCSW Group Therapy 11/18/2020 1:15pm  Type of Therapy and Topic:  Group Therapy:  Change and Accountability  Participation Level:  Did Not Attend  Description of Group In this group, patients discussed power and accountability for change.  The group identified the challenges related to accountability and the difficulty of accepting the outcomes of negative behaviors.  Patients were encouraged to openly discuss a challenge/change they could take responsibility for.  Patients discussed the use of "change talk" and positive thinking as ways to support achievement of personal goals.  The group discussed ways to give support and empowerment to peers.  Therapeutic Goals: 1. Patients will state the relationship between personal power and accountability in the change process 2. Patients will identify the positive and negative consequences of a personal choice they have made 3. Patients will identify one challenge/choice they will take responsibility for making 4. Patients will discuss the role of "change talk" and the impact of positive thinking as it supports successful personal change 5. Patients will verbalize support and affirmation of change efforts in peers  Summary of Patient Progress: Did not attend.   Therapeutic Modalities Solution Focused Brief Therapy Motivational Interviewing Cognitive Behavioral Therapy    Gordon Martin A [name], LCSW 11/18/2020 2:02 PM  

## 2020-11-18 NOTE — BHH Counselor (Signed)
CSW faxed pt's records to [facility] for review for placement at their facility.     Ruthann Cancer MSW, LCSW Clincal Social Worker  [facility]

## 2020-11-18 NOTE — Progress Notes (Addendum)
Pt focused on his guardianship situation. Pt made mention of his IVC and court. Pt was informed to talk to SW about the actual IVC paperwork, writer tried to explain the process and that the court will go along with the recommendations from the doctor about staying or D/C. Pt given PRN Trazodone and Vistaril per MAR with HS medications    11/18/20 2300  Psych Admission Type (Psych Patients Only)  Admission Status Involuntary  Psychosocial Assessment  Patient Complaints None  Eye Contact Fair  Facial Expression Other (Comment) (appropriate)  Affect Other (Comment) (WNL)  Speech Logical/coherent  Interaction Assertive  Motor Activity Other (Comment) (steady gait)  Appearance/Hygiene Unremarkable  Behavior Characteristics Cooperative  Mood Preoccupied;Pleasant  Thought Process  Coherency Concrete thinking  Content Blaming others;Delusions  Delusions Grandeur  Perception WDL  Hallucination None reported or observed  Judgment Poor  Confusion None  Danger to Self  Current suicidal ideation? Denies  Danger to Others  Danger to Others None reported or observed
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
## 2020-11-18 NOTE — Progress Notes (Signed)
D: Patient denies SI/HI/AVH, reports that his sleep quality last night was good, reports a good appetite, reports a good concentration level and reports a high energy level. Pt denies feeling hopeless, denies feeling anxious and denies feeling depressed. Pt reports that his goal for today is "discharge or to be sent to [facility]". Pt adds that it is concerning to him that he has been told that the [facility] "[facility]".  A:Pt is being given all meds as ordered, and is being maintained on Q15 minute safety checks   R: Will continue to monitor   11/18/20 1136  Psych Admission Type (Psych Patients Only)  Admission Status Involuntary  Psychosocial Assessment  Patient Complaints None  Eye Contact Fair  Facial Expression Other (Comment) (appropriate)  Affect Other (Comment) (WNL)  Speech Logical/coherent  Interaction Assertive  Motor Activity Other (Comment) (steady gait)  Appearance/Hygiene Unremarkable  Behavior Characteristics Cooperative  Mood Pleasant;Euthymic  Thought Administrator, sports thinking  Content Blaming others;Delusions  Delusions Grandeur  Perception WDL  Hallucination None reported or observed  Judgment Poor  Confusion None  Danger to Self  Current suicidal ideation? Denies  Danger to Others  Danger to Others None reported or observed

## 2020-11-18 NOTE — BHH Counselor (Signed)
CSW spoke with this patient regarding discharge plans. CSW shared with patient around possible placement at [facility] [place]. Pt stated he did not want to go there stating he did not want to be in a group home. CSW explained to pt that this was residential program and not a group home. Pt was not satisfied with this answer. Pt stated he wanted to go to the [place], CSW stated that currently the [place] facilities are on diversion, in which he stated he would like to go to the [facility] [place].   CSW explained to pt his guardians discharge plans, in which he continued to argue he no longer had a guardian. Once CSW explained to him that legally he had not yet received his guardianship back and once he found where the facility was located he changed his mind and requested to CSW to get him into treatment with them.     Ruthann Cancer MSW, LCSW Clincal Social Worker  [facility]

## 2020-11-18 NOTE — Tx Team (Signed)
Interdisciplinary Treatment and Diagnostic Plan Update  11/18/2020 Time of Session: 9:55am Gordon Martin MRN: [id number]  Principal Diagnosis: Schizoaffective disorder, bipolar type [facility])  Secondary Diagnoses: Principal Problem:   Schizoaffective disorder, bipolar type (HCC)   Current Medications:  Current Facility-Administered Medications  Medication Dose Route Frequency Provider Last Rate Last Admin  . acetaminophen (TYLENOL) tablet 650 mg  650 mg Oral Q6H PRN [name], MD      . allopurinol (ZYLOPRIM) tablet 100 mg  100 mg Oral BID [name], MD   100 mg at 11/18/20 0804  . alum & mag hydroxide-simeth (MAALOX/MYLANTA) 200-200-20 MG/5ML suspension 30 mL  30 mL Oral Q4H PRN [name], MD      . divalproex (DEPAKOTE) DR tablet 1,000 mg  1,000 mg Oral QPM [name], MD   1,000 mg at 11/17/20 2041  . divalproex (DEPAKOTE) DR tablet 750 mg  750 mg Oral Daily [name], MD   750 mg at 11/18/20 0803  . docusate sodium (COLACE) capsule 100 mg  100 mg Oral Daily [name], MD   100 mg at 11/18/20 0803  . feeding supplement (ENSURE ENLIVE / ENSURE PLUS) liquid 237 mL  237 mL Oral BID BM [name], MD   237 mL at 11/17/20 1439  . hydrOXYzine (ATARAX/VISTARIL) tablet 50 mg  50 mg Oral TID PRN [name], MD   50 mg at 11/17/20 2041  . LORazepam (ATIVAN) tablet 1 mg  1 mg Oral Q6H PRN [name], MD       Or  . LORazepam (ATIVAN) injection 1 mg  1 mg Intramuscular Q6H PRN [name], [name], MD      . magnesium hydroxide (MILK OF MAGNESIA) suspension 30 mL  30 mL Oral Daily PRN [name], MD      . multivitamin with minerals tablet 1 tablet  1 tablet Oral Daily [name], MD   1 tablet at 11/18/20 0803  . omega-3 acid ethyl esters (LOVAZA) capsule 1 g  1 g Oral BID [name], MD   1 g at 11/18/20 0804  . polyethylene glycol (MIRALAX / GLYCOLAX) packet 17 g  17 g Oral Daily [name], MD    17 g at 11/18/20 0804  . QUEtiapine (SEROQUEL) tablet 700 mg  700 mg Oral QHS [name], MD   700 mg at 11/17/20 2041  . traZODone (DESYREL) tablet 100 mg  100 mg Oral QHS PRN [name], MD   100 mg at 11/17/20 2041  . Vitamin D3 (Vitamin D) tablet 1,000 Units  1,000 Units Oral Daily [name], MD   1,000 Units at 11/18/20 0803  . ziprasidone (GEODON) injection 20 mg  20 mg Intramuscular Q12H PRN [name], MD       PTA Medications: Medications Prior to Admission  Medication Sig Dispense Refill Last Dose  . allopurinol (ZYLOPRIM) 100 MG tablet Take 100 mg by mouth in the morning and at bedtime.   Past Month at Unknown time  . divalproex (DEPAKOTE) 500 MG DR tablet Take 1 tablet (500 mg total) by mouth 2 (two) times daily before a meal. 1 tablet 0 Past Month at Unknown time  . QUEtiapine (SEROQUEL) 100 MG tablet Take 1 tablet by mouth at bedtime. (Patient not taking: Reported on 11/14/2020)   Not Taking at Unknown time    Patient Stressors: Financial difficulties Health problems Legal issue Marital or  family conflict Medication change or noncompliance  Patient Strengths: Average or above average intelligence Capable of independent living Supportive family/friends  Treatment Modalities: Medication Management, Group therapy, Case management,  1 to 1 session with clinician, Psychoeducation, Recreational therapy.   Physician Treatment Plan for Primary Diagnosis: Schizoaffective disorder, bipolar type (HCC) Long Term Goal(s): Improvement in symptoms so as ready for discharge   Short Term Goals: Ability to identify changes in lifestyle to reduce recurrence of condition will improve Ability to demonstrate self-control will improve Compliance with prescribed medications will improve  Medication Management: Evaluate patient's response, side effects, and tolerance of medication regimen.  Therapeutic Interventions: 1 to 1 sessions, Unit Group sessions and  Medication administration.  Evaluation of Outcomes: Progressing  Physician Treatment Plan for Secondary Diagnosis: Principal Problem:   Schizoaffective disorder, bipolar type (HCC)  Long Term Goal(s): Improvement in symptoms so as ready for discharge   Short Term Goals: Ability to identify changes in lifestyle to reduce recurrence of condition will improve Ability to demonstrate self-control will improve Compliance with prescribed medications will improve     Medication Management: Evaluate patient's response, side effects, and tolerance of medication regimen.  Therapeutic Interventions: 1 to 1 sessions, Unit Group sessions and Medication administration.  Evaluation of Outcomes: Progressing   RN Treatment Plan for Primary Diagnosis: Schizoaffective disorder, bipolar type (HCC) Long Term Goal(s): Knowledge of disease and therapeutic regimen to maintain health will improve  Short Term Goals: Ability to participate in decision making will improve, Ability to verbalize feelings will improve and Ability to identify and develop effective coping behaviors will improve  Medication Management: RN will administer medications as ordered by provider, will assess and evaluate patient's response and provide education to patient for prescribed medication. RN will report any adverse and/or side effects to prescribing provider.  Therapeutic Interventions: 1 on 1 counseling sessions, Psychoeducation, Medication administration, Evaluate responses to treatment, Monitor vital signs and CBGs as ordered, Perform/monitor CIWA, COWS, AIMS and Fall Risk screenings as ordered, Perform wound care treatments as ordered.  Evaluation of Outcomes: Progressing   LCSW Treatment Plan for Primary Diagnosis: Schizoaffective disorder, bipolar type (HCC) Long Term Goal(s): Safe transition to appropriate next level of care at discharge, Engage patient in therapeutic group addressing interpersonal concerns.  Short Term  Goals: Engage patient in aftercare planning with referrals and resources, Increase social support and Increase ability to appropriately verbalize feelings  Therapeutic Interventions: Assess for all discharge needs, 1 to 1 time with Social worker, Explore available resources and support systems, Assess for adequacy in community support network, Educate family and significant other(s) on suicide prevention, Complete Psychosocial Assessment, Interpersonal group therapy.  Evaluation of Outcomes: Progressing   Progress in Treatment: Attending groups: Yes. Participating in groups: Yes. Taking medication as prescribed: Yes. Toleration medication: Yes. Family/Significant other contact made: No, will contact:  Pt's guardain Patient understands diagnosis: No. Discussing patient identified problems/goals with staff: Yes. Medical problems stabilized or resolved: Yes. Denies suicidal/homicidal ideation: Yes. Issues/concerns per patient self-inventory: No. Other: None  New problem(s) identified: No, Describe:  None  New Short Term/Long Term Goal(s):medication stabilization, elimination of SI thoughts, development of comprehensive mental wellness plan.  Patient Goals:  "move into [place]."  Discharge Plan or Barriers: Patient recently admitted. CSW will continue to follow and assess for appropriate referrals and possible discharge planning.  Reason for Continuation of Hospitalization: Homicidal ideation Medication stabilization  Estimated Length of Stay: 3-5 days  Attendees: Patient: Gordon Martin 11/13/2020   Physician: Dr. Landry Mellow 11/13/2020  Nursing:  11/13/2020   RN Care Manager: 11/13/2020   Social Worker: Fredirick Lathe, LCSWA  11/13/2020   Recreational Therapist:  11/13/2020   Other:  11/13/2020   Other:  11/13/2020   Other: 11/13/2020      Scribe for Treatment Team: Felizardo Hoffmann, Theresia Majors 11/18/2020 11:21 AM
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
## 2020-11-19 NOTE — Progress Notes (Signed)
   11/19/20 0500  Sleep  Number of Hours 7.25

## 2020-11-19 NOTE — [facility] Counselor (Signed)
CSW spoke with this patients DSS guardian, [name] [phone]). She states that she has been working with this patient since 2016 when he was released from [facility].   She stated that from 2016 until about a year ago he was clear the entire time to the point she was ready to have his rights restored. Per Ms. Monroe around the time of the 2020 election this patient began to act differently and shortly after stopped taking his medications. She believes he has been off of his medications for over a year now.   Per Ms. Monroe when he is at his baseline he is clear, clam, and  Rational. She states warning signs that he is not doing well is when he dyes his hair which she stated he did right before being sent to the hospital.    Ruthann Cancer MSW, LCSW Clincal Social Worker  [facility] [place] [facility]

## 2020-11-19 NOTE — Progress Notes (Addendum)
[facility]  11/19/2020 7:51 AM Gordon Bardodd B Bentz  MRN:  [id number]   Chief Complaint: paranoia, mood lability  Subjective:  Gordon Martin is a 55 y.o. male with a history of schizoaffective d/o bipolar type, who was initially admitted for inpatient psychiatric hospitalization on 11/12/2020 for management of violent behaviors, mood lability, and paranoia in the context of medication noncompliance. The patient is currently on Hospital Day 7.   Chart Review from last 24 hours:  The patient's chart was reviewed and nursing notes were reviewed. The patient's case was discussed in multidisciplinary team meeting.Per nursing, the patient was ruminative about his guardianship and IVC and continued to have delusional thinking. He had no behavioral issues noted. Per MAR, he was compliant with scheduled medications and received PRN Vistaril X1 for anxiety and PRN Trazodone X1 for sleep.   Information Obtained Today During Patient Interview: The patient was seen and evaluated on the unit. On exam today the patient admits he was upset with staff yesterday about his potential discharge options after discussion with physician and social worker. He states he "skipped a meal" yesterday when he was upset, but today he reports good appetite. He states his sleep has been fair and he voices no physical complaints other than some residual constipation. He moved his bowels today and continues to refuse PRN mag citrate or PRN MOM. He wishes to continue Miralax and Colace and was encouraged to drink more fluids. He denies medication side-effects and was reminded that we will need to repeat labs this weekend for monitoring of his platelet count on VPA. He denies SI, HI, AVH, paranoia, ideas of reference, or first rank symtpoms. Overall he has a brighter affect on exam, is less irritable, and does not make delusional statements today. He continues to ruminate about potential housing options after discharge and does become  tangential at conclusion of interview about his guardianship status.   Principal Problem: Schizoaffective disorder, bipolar type (HCC) Diagnosis: Principal Problem:   Schizoaffective disorder, bipolar type (HCC)  Total Time Spent in Direct Patient Care:  I personally spent 30 minutes on the unit in direct patient care. The direct patient care time included face-to-face time with the patient, reviewing the patient's chart, communicating with other professionals, and coordinating care. Greater than 50% of this time was spent in counseling or coordinating care with the patient regarding goals of hospitalization, psycho-education, and discharge planning needs.  Past Psychiatric History: see admission H&P  Past Medical History:  Past Medical History:  Diagnosis Date  . Bipolar disorder (HCC)   . Gout     Family History: see admission H&P  Family Psychiatric  History: see admission H&P  Social History:  Social History   Substance and Sexual Activity  Alcohol Use No     Social History   Substance and Sexual Activity  Drug Use No    Social History   Socioeconomic History  . Marital status: Married    Spouse name: Not on file  . Number of children: Not on file  . Years of education: Not on file  . Highest education level: Not on file  Occupational History  . Not on file  Tobacco Use  . Smoking status: Never Smoker  . Smokeless tobacco: Never Used  Substance and Sexual Activity  . Alcohol use: No  . Drug use: No  . Sexual activity: Not on file  Other Topics Concern  . Not on file  Social History Narrative  . Not on file  Social Determinants of Health   Financial Resource Strain: Not on file  Food Insecurity: Not on file  Transportation Needs: Not on file  Physical Activity: Not on file  Stress: Not on file  Social Connections: Not on file   Sleep: Fair per patient  Appetite:  Good  Current Medications: Current Facility-Administered Medications  Medication  Dose Route Frequency Provider Last Rate Last Admin  . acetaminophen (TYLENOL) tablet 650 mg  650 mg Oral Q6H PRN [name], MD      . allopurinol (ZYLOPRIM) tablet 100 mg  100 mg Oral BID [name], MD   100 mg at 11/18/20 1615  . alum & mag hydroxide-simeth (MAALOX/MYLANTA) 200-200-20 MG/5ML suspension 30 mL  30 mL Oral Q4H PRN [name], MD      . antiseptic oral rinse (BIOTENE) solution 15 mL  15 mL Mouth Rinse PRN [name], Kameren Baade E, MD      . divalproex (DEPAKOTE) DR tablet 1,000 mg  1,000 mg Oral QPM [name], MD   1,000 mg at 11/18/20 1615  . divalproex (DEPAKOTE) DR tablet 750 mg  750 mg Oral Daily [name], MD   750 mg at 11/18/20 0803  . docusate sodium (COLACE) capsule 100 mg  100 mg Oral Daily [name], MD   100 mg at 11/18/20 0803  . feeding supplement (ENSURE ENLIVE / ENSURE PLUS) liquid 237 mL  237 mL Oral BID BM [name], MD   237 mL at 11/18/20 1616  . hydrOXYzine (ATARAX/VISTARIL) tablet 50 mg  50 mg Oral TID PRN [name], MD   50 mg at 11/18/20 2109  . LORazepam (ATIVAN) tablet 1 mg  1 mg Oral Q6H PRN [name], MD       Or  . LORazepam (ATIVAN) injection 1 mg  1 mg Intramuscular Q6H PRN [name], Creedon Danielski E, MD      . magnesium citrate solution 0.5 Bottle  0.5 Bottle Oral Once PRN [name], Aragorn Recker E, MD      . magnesium hydroxide (MILK OF MAGNESIA) suspension 30 mL  30 mL Oral Daily PRN [name], MD      . multivitamin with minerals tablet 1 tablet  1 tablet Oral Daily [name], MD   1 tablet at 11/18/20 0803  . omega-3 acid ethyl esters (LOVAZA) capsule 1 g  1 g Oral BID [name], MD   1 g at 11/18/20 2109  . polyethylene glycol (MIRALAX / GLYCOLAX) packet 17 g  17 g Oral Daily [name], MD   17 g at 11/18/20 0804  . QUEtiapine (SEROQUEL) tablet 800 mg  800 mg Oral QHS [name] E, MD   800 mg at 11/18/20 2108  . traZODone (DESYREL) tablet 100 mg  100 mg  Oral QHS PRN [name], MD   100 mg at 11/18/20 2109  . Vitamin D3 (Vitamin D) tablet 1,000 Units  1,000 Units Oral Daily [name], MD   1,000 Units at 11/18/20 0803  . ziprasidone (GEODON) injection 20 mg  20 mg Intramuscular Q12H PRN [name], MD       Lab Results:  Results for orders placed or performed during the hospital encounter of 11/12/20 (from the past 48 hour(s))  Valproic acid level     Status: None   Collection Time: 11/18/20  6:45 AM  Result Value Ref Range   Valproic Acid Lvl 72 50.0 - 100.0  ug/mL    Comment: Performed at [facility], [address], [place], [place] [address]  CBC with Differential/Platelet     Status: Abnormal   Collection Time: 11/18/20  6:45 AM  Result Value Ref Range   WBC 4.0 4.0 - 10.5 K/uL   RBC 4.24 4.22 - 5.81 MIL/uL   Hemoglobin 14.0 13.0 - 17.0 g/dL   HCT 30.8 65.7 - 84.6 %   MCV 95.3 80.0 - 100.0 fL   MCH 33.0 26.0 - 34.0 pg   MCHC 34.7 30.0 - 36.0 g/dL   RDW 96.2 95.2 - 84.1 %   Platelets 126 (L) 150 - 400 K/uL   nRBC 0.0 0.0 - 0.2 %   Neutrophils Relative % 45 %   Neutro Abs 1.9 1.7 - 7.7 K/uL   Lymphocytes Relative 39 %   Lymphs Abs 1.6 0.7 - 4.0 K/uL   Monocytes Relative 11 %   Monocytes Absolute 0.4 0.1 - 1.0 K/uL   Eosinophils Relative 2 %   Eosinophils Absolute 0.1 0.0 - 0.5 K/uL   Basophils Relative 2 %   Basophils Absolute 0.1 0.0 - 0.1 K/uL   Immature Granulocytes 1 %   Abs Immature Granulocytes 0.04 0.00 - 0.07 K/uL    Comment: Performed at [facility], [address] [place], [place] [address]  TSH     Status: None   Collection Time: 11/18/20  6:45 AM  Result Value Ref Range   TSH 3.349 0.350 - 4.500 uIU/mL    Comment: Performed by a 3rd Generation assay with a functional sensitivity of <=0.01 uIU/mL. Performed at [facility], [address] [place], [place] [address]   Comprehensive metabolic panel     Status: Abnormal    Collection Time: 11/18/20  6:45 AM  Result Value Ref Range   Sodium 137 135 - 145 mmol/L   Potassium 4.3 3.5 - 5.1 mmol/L   Chloride 103 98 - 111 mmol/L   CO2 23 22 - 32 mmol/L   Glucose, Bld 92 70 - 99 mg/dL    Comment: Glucose reference range applies only to samples taken after fasting for at least 8 hours.   BUN 29 (H) 6 - 20 mg/dL   Creatinine, Ser 5.36 0.61 - 1.24 mg/dL   Calcium 8.9 8.9 - 64.4 mg/dL   Total Protein 6.4 (L) 6.5 - 8.1 g/dL   Albumin 3.8 3.5 - 5.0 g/dL   AST 25 15 - 41 U/L   ALT 22 0 - 44 U/L   Alkaline Phosphatase 63 38 - 126 U/L   Total Bilirubin 0.6 0.3 - 1.2 mg/dL   GFR, Estimated >03 >47 mL/min    Comment: (NOTE) Calculated using the CKD-EPI Creatinine Equation (2021)    Anion gap 11 5 - 15    Comment: Performed at [facility], [address] [place], [place] [address]  Lipid panel     Status: Abnormal   Collection Time: 11/18/20  6:45 AM  Result Value Ref Range   Cholesterol 167 0 - 200 mg/dL   Triglycerides 638 (H) <150 mg/dL   HDL 33 (L) >75 mg/dL   Total CHOL/HDL Ratio 5.1 RATIO   VLDL 46 (H) 0 - 40 mg/dL   LDL Cholesterol 88 0 - 99 mg/dL    Comment:        Total Cholesterol/HDL:CHD Risk Coronary Heart Disease Risk Table  Men   Women  1/2 Average Risk   3.4   3.3  Average Risk       5.0   4.4  2 X Average Risk   9.6   7.1  3 X Average Risk  23.4   11.0        Use the calculated Patient Ratio above and the CHD Risk Table to determine the patient's CHD Risk.        ATP III CLASSIFICATION (LDL):  <100     mg/dL   Optimal  767-341  mg/dL   Near or Above                    Optimal  130-159  mg/dL   Borderline  937-902  mg/dL   High  >409     mg/dL   Very High Performed at [facility], [address], [place], [place] [address]   Hemoglobin A1c     Status: None   Collection Time: 11/18/20  6:45 AM  Result Value Ref Range   Hgb A1c MFr Bld 5.3 4.8 - 5.6 %    Comment: (NOTE) Pre  diabetes:          5.7%-6.4%  Diabetes:              >6.4%  Glycemic control for   <7.0% adults with diabetes    Mean Plasma Glucose 105.41 mg/dL    Comment: Performed at [facility], [address] [place], [place] [address]    Blood Alcohol level:  Lab Results  Component Value Date   [facility] <10 11/11/2020   ETH <5 09/11/2014    Metabolic Disorder Labs: Lab Results  Component Value Date   HGBA1C 5.3 11/18/2020   MPG 105.41 11/18/2020   MPG 102.54 11/16/2020   No results found for: PROLACTIN Lab Results  Component Value Date   CHOL 167 11/18/2020   TRIG 230 (H) 11/18/2020   HDL 33 (L) 11/18/2020   CHOLHDL 5.1 11/18/2020   VLDL 46 (H) 11/18/2020   LDLCALC 88 11/18/2020   LDLCALC 73 11/16/2020    Physical Findings: AIMS: Facial and Oral Movements Muscles of Facial Expression: None, normal Lips and Perioral Area: None, normal Jaw: None, normal Tongue: None, normal,Extremity Movements Upper (arms, wrists, hands, fingers): None, normal Lower (legs, knees, ankles, toes): None, normal, Trunk Movements Neck, shoulders, hips: None, normal, Overall Severity Severity of abnormal movements (highest score from questions above): None, normal Incapacitation due to abnormal movements: None, normal Patient's awareness of abnormal movements (rate only patient's report): No Awareness, Dental Status Current problems with teeth and/or dentures?: No Does patient usually wear dentures?: No     Musculoskeletal: Strength & Muscle Tone: within normal limits Gait & Station: normal, steady Patient leans: N/A  Psychiatric Specialty Exam: Physical Exam Vitals reviewed.  HENT:     Head: Normocephalic.  Pulmonary:     Effort: Pulmonary effort is normal.  Neurological:     Mental Status: He is alert.     Review of Systems  HENT:       Dry mouth  Respiratory: Negative for shortness of breath.   Cardiovascular: Negative for chest pain.  Gastrointestinal: Positive for  constipation. Negative for diarrhea, nausea and vomiting.    Blood pressure 121/72, pulse 85, temperature 97.6 F (36.4 C), temperature source Oral, resp. rate 18, height 5\' 11"  (1.803 m), weight 88.5 kg, SpO2 97 %.Body mass index is 27.2 kg/m.  General Appearance: wearing  same clothes, unkempt appearing  Eye Contact:  Good  Speech: Clear and coherent, normal rate  Volume:  Normal  Mood:  Described as "good" - appears less irritable today  Affect: brighter affect, less irritable and less guarded  Thought Process:  Superficially more goal directed and linear - occasionally tangential; ruminative about discharge planning  Orientation:  Oriented to self, place, and situation  Thought Content: Denies AVH, paranoia, ideas of reference, or first rank symtpoms - appears less paranoid and less guarded today; does not make delusional statements  Suicidal Thoughts:  No  Homicidal Thoughts:  No  Memory:  Recent;   Fair  Judgement:  Impaired  Insight:  Lacking  Psychomotor Activity:  Normal   Concentration:  Concentration: Fair and Attention Span: Fair  Recall:  [employer] of Knowledge:  Fair  Language:  Good  Akathisia:  Negative  Assets:  Communication Skills Desire for Improvement Resilience Social Support  ADL's:  independent  Cognition:  WNL  Sleep:  Number of Hours: 7.25   Treatment Plan Summary: Diagnoses / Active Problems: Schizoaffective d/o bipolar type by hx Gout HTN Constipation  PLAN: 1. Safety and Monitoring:  -- Involuntary admission to inpatient psychiatric unit for safety, stabilization and treatment  -- Daily contact with patient to assess and evaluate symptoms and progress in treatment  -- Patient's case to be discussed in multi-disciplinary team meeting  -- Observation Level : q15 minute checks  -- Vital signs:  q12 hours  -- Precautions: suicide  2. Psychiatric Diagnoses and Treatment:  Schizoaffective d/o bipolar type by hx -- Attempted to call guardian  11/18/20 but no answer and voicemail full -- Continue Seroquel 800mg  po qhs  -- Continue VPA 750mg  qam and 1000mg  po every evening (11/18/20: VPA level 72; 11/17/20: WBC 4.0 with ANC 1900; H/H 14.0/40.4, platelets 126; AST 25, ALT 22)  Will repeat CBC Saturday for trending of platelet count  -- Continue Vistaril 50mg  tid PRN anxiety -- Continue Omega-3 fatty acids 1g po bid for neuro-protection -- Continue Vit D 3000 units daily, Ensure bid, and MVI daily for nutritional supplementation -- Continue Trazodone 100mg  po qhs PRN insomnia -- Agitation protocol (Ativan 1mg  po or IM q 6 hours PRN agitation and Geodon 20mg  IM q12hrs PRN agitation)  -- Metabolic profile and EKG monitoring obtained while on an atypical antipsychotic (BMI:27.20 Lipid Panel:cholesterol 167, triglycerides 230, HDL 33, LDL 88; HbgA1c:5.3; QTc:442ms and on repeat 02-15-1980)   -- Encouraged patient to participate in unit milieu and in scheduled group therapies   -- Short Term Goals: Ability to identify changes in lifestyle to reduce recurrence of condition will improve, Ability to demonstrate self-control will improve and Compliance with prescribed medications will improve  -- Long Term Goals: Improvement in symptoms so as ready for discharge   High Risk Medication Use: -- The complexity of this patient's case involves drug therapy with Depakote which requires intensive monitoring for toxicity. This is in part due to a narrow therapeutic window as well as potential for toxicity with this medication.    3. Medical Issues Being Addressed:   HTN  -- Monitoring BP off Lasix   Gout  -- Continue Allopurinol 100mg  po bid   Constipation  -- Continue Colace 100mg  daily and Miralax 17g daily; encouraged increased po fluids;  Mag citrate PRN X1 if needed and MOM PRN if needed   Dry Mouth:   -- Biotene PRN ordered   EKG changes   -- Repeat EKG on 11/17/20 shows  NSR 68bpm with QTc with questionable age-indeterminate septal  infarct  -- Consulted Cardiology (Dr. Izora Ribas) - he reviewed his EKG from 11/17/20 and compared to previous EKGs and does not believe findings represent a septal infarct pattern and he does not see acute ischemic changes or QTc prolongation   Thrombocytopenia (platelets 126)  -- Continue to monitor while on antipsychotic and VPA - rechecking CBC Saturday   Hypertriglyceridemia  -- Is already on Omega 3 fatty acid - will need to be rechecked by PCP after discharge in context of atypical antipsychotic use  4. Discharge Planning:   -- Social work and case management to assist with discharge planning and identification of hospital follow-up needs prior to discharge  -- Estimated LOS: TBD  -- Discharge Concerns: Need to establish a safety plan; Medication compliance and effectiveness  -- Discharge Goals: Return home with outpatient referrals for mental health follow-up including medication management/psychotherapy  [name], MD, FAPA 11/19/2020, 7:51 AM
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
## 2020-11-19 NOTE — [facility] Group Notes (Signed)
Occupational Therapy Group Note Date: 11/19/2020 Group Topic/Focus: Feelings Management  Group Description: Group encouraged increased engagement and participation through discussion focused on Building Happiness. Patients were provided a handout and reviewed therapeutic strategies to build happiness including identifying gratitudes, random acts of kindness, exercise, meditation, positive journaling, and fostering relationships. Patients engaged in discussion and encouraged to reflect on each strategy and their experiences. Participation Level: Active   Participation Quality: Independent   Behavior: Calm and Cooperative   Speech/Thought Process: Disorganized   Affect/Mood: Full range   Insight: Impaired   Judgement: Impaired   Individualization: Marvion was active in their participation of group discussion/activity, however continues to remain disorganized in his responses to discussion. Pt identified being a "jack of all trades" however did share that bringing his dog to the [employer] park in [place] was always something that brought him happiness.   Modes of Intervention: Discussion, Education and Socialization  Patient Response to Interventions:  Attentive   Plan: Continue to engage patient in OT groups 2 - 3x/week.  11/19/2020  Donne Hazel, MOT, OTR/L

## 2020-11-19 NOTE — Progress Notes (Signed)
Recreation Therapy Notes  Date: 4.14.22 Time: 1000 Location: [address] Dayroom  Group Topic: Self-Esteem  Goal Area(s) Addresses:  Patient will successfully identify positive attributes about themselves.  Patient will successfully identify benefit of improved self-esteem.   Behavioral Response: Engaged  Intervention: Administrator, sports, Markers  Activity: Look In Pensions consultant.  Patients were to picture looking at themselves in the mirror and highlight the positive qualities about themselves.  Patients were encouraged to look deep because it's easy to think of the negative qualities and sometimes it's difficult to find the positive.    Education:  Self-Esteem, Discharge Planning  Education Outcome: Acknowledges education/In group clarification offered/Needs additional education  Clinical Observations/Feedback: Pt put a cube on his mirror as in [employer] because he likes his all around qualities.  Pt also stated he loves people in general unless backed into a corner and will fight.   Caroll Rancher, LRT/CTRS    Lillia Abed, Beatris Belen A 11/19/2020 11:06 AM

## 2020-11-20 NOTE — Progress Notes (Addendum)
[facility] MD Progress Note  11/20/2020 7:33 AM Gordon Martin  MRN:  [id number]   Chief Complaint: paranoia, mood lability  Subjective:  Gordon Martin is a 55 y.o. male with a history of schizoaffective d/o bipolar type, who was initially admitted for inpatient psychiatric hospitalization on 11/12/2020 for management of violent behaviors, mood lability, and paranoia in the context of medication noncompliance. The patient is currently on Hospital Day 8.   Chart Review from last 24 hours:  The patient's chart was reviewed and nursing notes were reviewed. The patient's case was discussed in multidisciplinary team meeting. Per nursing, the patient is still ruminating about his placement options after discharge and expressed belief to staff that he does not have a guardian. He attended groups. Per MAR he was compliant with scheduled medications and received Vistaril X1 for anxiety and Trazodone X1 for sleep.   Information Obtained Today During Patient Interview: The patient was seen and evaluated on the unit. He reports that his mood is stable, and he denies SI or HI. He denies ideas of reference or first rank symptoms. He admits that he is chronically paranoid that his sister is "seeking retribution" against him and derails into discussion about how she illegally opened his mail in the past. When questioned about AVH he denies having true hallucinations and instead states "I'm just spiritual." He denies racing thoughts and reports improved sleep. He denies medication side-effects and reports good appetite with improving constipation. He states his bowels are still a little hard and I discussed increasing his colace to bid and having him drink more fluids. He continues to decline offer of mag citrate or MOM. He spent time discussing his appeal to restore his competency and get his own guardianship but does not ruminate today on post-discharge housing. He was more redirectable and less irritable overall today.    Principal Problem: Schizoaffective disorder, bipolar type (HCC) Diagnosis: Principal Problem:   Schizoaffective disorder, bipolar type (HCC)  Total Time Spent in Direct Patient Care:  I personally spent 25 minutes on the unit in direct patient care. The direct patient care time included face-to-face time with the patient, reviewing the patient's chart, communicating with other professionals, and coordinating care. Greater than 50% of this time was spent in counseling or coordinating care with the patient regarding goals of hospitalization, psycho-education, and discharge planning needs.  Past Psychiatric History: see admission H&P  Past Medical History:  Past Medical History:  Diagnosis Date  . Bipolar disorder (HCC)   . Gout     Family History: see admission H&P  Family Psychiatric  History: see admission H&P  Social History:  Social History   Substance and Sexual Activity  Alcohol Use No     Social History   Substance and Sexual Activity  Drug Use No    Social History   Socioeconomic History  . Marital status: Married    Spouse name: Not on file  . Number of children: Not on file  . Years of education: Not on file  . Highest education level: Not on file  Occupational History  . Not on file  Tobacco Use  . Smoking status: Never Smoker  . Smokeless tobacco: Never Used  Substance and Sexual Activity  . Alcohol use: No  . Drug use: No  . Sexual activity: Not on file  Other Topics Concern  . Not on file  Social History Narrative  . Not on file   Social Determinants of Health   Financial Resource Strain:  Not on file  Food Insecurity: Not on file  Transportation Needs: Not on file  Physical Activity: Not on file  Stress: Not on file  Social Connections: Not on file   Sleep: Improved per patient  Appetite:  Good  Current Medications: Current Facility-Administered Medications  Medication Dose Route Frequency Provider Last Rate Last Admin  .  acetaminophen (TYLENOL) tablet 650 mg  650 mg Oral Q6H PRN Antonieta Pertlary, Greg Lawson, MD      . allopurinol (ZYLOPRIM) tablet 100 mg  100 mg Oral BID Antonieta Pertlary, Greg Lawson, MD   100 mg at 11/19/20 1717  . alum & mag hydroxide-simeth (MAALOX/MYLANTA) 200-200-20 MG/5ML suspension 30 mL  30 mL Oral Q4H PRN Antonieta Pertlary, Greg Lawson, MD      . antiseptic oral rinse (BIOTENE) solution 15 mL  15 mL Mouth Rinse PRN Mason JimSingleton, Billal Rollo E, MD      . divalproex (DEPAKOTE) DR tablet 1,000 mg  1,000 mg Oral QPM Antonieta Pertlary, Greg Lawson, MD   1,000 mg at 11/19/20 1717  . divalproex (DEPAKOTE) DR tablet 750 mg  750 mg Oral Daily Antonieta Pertlary, Greg Lawson, MD   750 mg at 11/19/20 0820  . docusate sodium (COLACE) capsule 100 mg  100 mg Oral Daily Antonieta Pertlary, Greg Lawson, MD   100 mg at 11/19/20 0820  . feeding supplement (ENSURE ENLIVE / ENSURE PLUS) liquid 237 mL  237 mL Oral BID BM Antonieta Pertlary, Greg Lawson, MD   237 mL at 11/19/20 1717  . hydrOXYzine (ATARAX/VISTARIL) tablet 50 mg  50 mg Oral TID PRN Antonieta Pertlary, Greg Lawson, MD   50 mg at 11/19/20 2049  . LORazepam (ATIVAN) tablet 1 mg  1 mg Oral Q6H PRN Comer LocketSingleton, Teaghan Formica E, MD       Or  . LORazepam (ATIVAN) injection 1 mg  1 mg Intramuscular Q6H PRN Mason JimSingleton, Clydene Burack E, MD      . magnesium citrate solution 0.5 Bottle  0.5 Bottle Oral Once PRN Mason JimSingleton, Kortney Potvin E, MD      . magnesium hydroxide (MILK OF MAGNESIA) suspension 30 mL  30 mL Oral Daily PRN Antonieta Pertlary, Greg Lawson, MD      . multivitamin with minerals tablet 1 tablet  1 tablet Oral Daily Antonieta Pertlary, Greg Lawson, MD   1 tablet at 11/19/20 0820  . omega-3 acid ethyl esters (LOVAZA) capsule 1 g  1 g Oral BID Antonieta Pertlary, Greg Lawson, MD   1 g at 11/19/20 1717  . polyethylene glycol (MIRALAX / GLYCOLAX) packet 17 g  17 g Oral Daily Antonieta Pertlary, Greg Lawson, MD   17 g at 11/19/20 0819  . QUEtiapine (SEROQUEL) tablet 800 mg  800 mg Oral QHS Mason JimSingleton, Sayid Moll E, MD   800 mg at 11/19/20 2049  . traZODone (DESYREL) tablet 100 mg  100 mg Oral QHS PRN Antonieta Pertlary, Greg Lawson, MD   100 mg at 11/19/20  2049  . Vitamin D3 (Vitamin D) tablet 1,000 Units  1,000 Units Oral Daily Antonieta Pertlary, Greg Lawson, MD   1,000 Units at 11/19/20 0820  . ziprasidone (GEODON) injection 20 mg  20 mg Intramuscular Q12H PRN Comer LocketSingleton, Ezinne Yogi E, MD       Lab Results:  No results found for this or any previous visit (from the past 48 hour(s)).  Blood Alcohol level:  Lab Results  Component Value Date   [facility]   ETH <5 09/11/2014    Metabolic Disorder Labs: Lab Results  Component Value Date   HGBA1C 5.3 11/18/2020   MPG 105.41 11/18/2020  MPG 102.54 11/16/2020   No results found for: PROLACTIN Lab Results  Component Value Date   CHOL 167 11/18/2020   TRIG 230 (H) 11/18/2020   HDL 33 (L) 11/18/2020   CHOLHDL 5.1 11/18/2020   VLDL 46 (H) 11/18/2020   LDLCALC 88 11/18/2020   LDLCALC 73 11/16/2020    Physical Findings: AIMS: Facial and Oral Movements Muscles of Facial Expression: None, normal Lips and Perioral Area: None, normal Jaw: None, normal Tongue: None, normal,Extremity Movements Upper (arms, wrists, hands, fingers): None, normal Lower (legs, knees, ankles, toes): None, normal, Trunk Movements Neck, shoulders, hips: None, normal, Overall Severity Severity of abnormal movements (highest score from questions above): None, normal Incapacitation due to abnormal movements: None, normal Patient's awareness of abnormal movements (rate only patient's report): No Awareness, Dental Status Current problems with teeth and/or dentures?: No Does patient usually wear dentures?: No     Musculoskeletal: Strength & Muscle Tone: within normal limits Gait & Station: normal, steady Patient leans: N/A  Psychiatric Specialty Exam: Physical Exam Vitals reviewed.  HENT:     Head: Normocephalic.  Pulmonary:     Effort: Pulmonary effort is normal.  Neurological:     Mental Status: He is alert.     Review of Systems  HENT:       Dry mouth  Respiratory: Negative for shortness of breath.    Cardiovascular: Negative for chest pain.  Gastrointestinal: Positive for constipation. Negative for diarrhea, nausea and vomiting.    Blood pressure 112/73, pulse 72, temperature 97.9 F (36.6 C), temperature source Oral, resp. rate 18, height 5\' 11"  (1.803 m), weight 88.5 kg, SpO2 96 %.Body mass index is 27.2 kg/m.  General Appearance: wearing scrubs with tank top over, fair hygiene   Eye Contact:  Good  Speech: Clear and coherent, normal rate  Volume:  Normal  Mood:  Described as "good" - more calm and euthymic today  Affect: brighter affect, less irritable and less guarded, less grandiose  Thought Process:  Superficially more goal directed and linear - occasionally tangential; ruminative about guardianship  Orientation:  Oriented to self, place, and situation  Thought Content: Denies AVH, paranoia, ideas of reference, or first rank symptoms - endorses residual paranoia about his sister but appears less guarded on exam; no acute response to internal stimuli noted on exam; no grandiose delusional statements today  Suicidal Thoughts:  No  Homicidal Thoughts:  No  Memory:  Recent;   Fair  Judgement:  Impaired  Insight:  Lacking  Psychomotor Activity:  Normal   Concentration: Improved  Recall:  of Knowledge:  Fair  Language:  Good  Akathisia:  Negative  Assets:  Communication Skills Desire for Improvement Resilience Social Support  ADL's:  independent  Cognition:  WNL  Sleep:  Number of Hours: 7.25   Treatment Plan Summary: Diagnoses / Active Problems: Schizoaffective d/o bipolar type by hx Gout HTN Constipation  PLAN: 1. Safety and Monitoring:  -- Involuntary admission to inpatient psychiatric unit for safety, stabilization and treatment  -- Daily contact with patient to assess and evaluate symptoms and progress in treatment  -- Patient's case to be discussed in multi-disciplinary team meeting  -- Observation Level : q15 minute checks  -- Vital signs:  q12  hours  -- Precautions: suicide  2. Psychiatric Diagnoses and Treatment:  Schizoaffective d/o bipolar type by hx -- Called guardian 11/20/20 [phone]) - updated guardian on his clinical status and medications as well as his plate drop that is being monitored  while on VPA - she states that in the past he was stable on a combination of Zyprexa and Lithium and Neurontin - she states he has not had any known medical complications from Lithium in the past and that he took himself off Lithium prior to decompensating. We discussed options if his platelets continue to drop, and she states that if he needs to go back to Lithium or Zyprexa she would support this decision if indicated based on behaviors or labs. -- Continue Seroquel 800mg  po qhs  -- Continue VPA 750mg  qam and 1000mg  po every evening (11/18/20: VPA level 72; 11/17/20: WBC 4.0 with ANC 1900; H/H 14.0/40.4, platelets 126; AST 25, ALT 22)  Will repeat CBC tomorrow for trending of platelet count  -- Continue Vistaril 50mg  tid PRN anxiety -- Continue Omega-3 fatty acids 1g po bid for neuro-protection -- Continue Vit D 3000 units daily, Ensure bid, and MVI daily for nutritional supplementation -- Continue Trazodone 100mg  po qhs PRN insomnia -- Agitation protocol (Ativan 1mg  po or IM q 6 hours PRN agitation and Geodon 20mg  IM q12hrs PRN agitation)  -- Metabolic profile and EKG monitoring obtained while on an atypical antipsychotic (BMI:27.20 Lipid Panel:cholesterol 167, triglycerides 230, HDL 33, LDL 88; HbgA1c:5.3; QTc:425ms and on repeat 01/17/21)   -- Encouraged patient to participate in unit milieu and in scheduled group therapies   -- Short Term Goals: Ability to identify changes in lifestyle to reduce recurrence of condition will improve, Ability to demonstrate self-control will improve and Compliance with prescribed medications will improve  -- Long Term Goals: Improvement in symptoms so as ready for discharge   High Risk Medication Use: -- The  complexity of this patient's case involves drug therapy with Depakote which requires intensive monitoring for toxicity. This is in part due to a narrow therapeutic window as well as potential for toxicity with this medication.    3. Medical Issues Being Addressed:   HTN  -- Monitoring BP off Lasix   Gout  -- Continue Allopurinol 100mg  po bid   Constipation  -- Increase Colace to 100mg  bid and continue Miralax 17g daily; encouraged increased po fluids;  Mag citrate PRN X1 if needed and MOM PRN if needed   Dry Mouth:   -- Biotene PRN ordered   EKG changes   -- Repeat EKG on 11/17/20 shows NSR 68bpm with QTc with questionable age-indeterminate septal infarct  -- Consulted Cardiology (Dr. ) - he reviewed his EKG from 11/17/20 and compared to previous EKGs and does not believe findings represent a septal infarct pattern and he does not see acute ischemic changes or QTc prolongation   Thrombocytopenia (platelets 126)  -- Continue to monitor while on antipsychotic and VPA - rechecking CBC Saturday   Hypertriglyceridemia  -- Is already on Omega 3 fatty acid - will need to be rechecked by PCP after discharge in context of atypical antipsychotic use  4. Discharge Planning:   -- Social work and case management to assist with discharge planning and identification of hospital follow-up needs prior to discharge  -- Estimated LOS: TBD  -- Discharge Concerns: Need to establish a safety plan; Medication compliance and effectiveness  -- Discharge Goals: Return home with outpatient referrals for mental health follow-up including medication management/psychotherapy  20m, MD, FAPA 11/20/2020, 7:33 AM

## 2020-11-20 NOTE — Progress Notes (Signed)
   11/20/20 1100  Psych Admission Type (Psych Patients Only)  Admission Status Involuntary  Psychosocial Assessment  Patient Complaints None  Eye Contact Fair  Facial Expression Other (Comment) (appropriate)  Affect Other (Comment) (WNL)  Speech Logical/coherent  Interaction Assertive  Motor Activity Other (Comment) (steady gait)  Appearance/Hygiene Unremarkable  Behavior Characteristics Cooperative  Mood Preoccupied  Thought Process  Coherency Concrete thinking  Content Blaming others;Delusions  Delusions Grandeur  Perception WDL  Hallucination None reported or observed  Judgment Poor  Confusion None  Danger to Self  Current suicidal ideation? Denies  Danger to Others  Danger to Others None reported or observed  Dar Note: Patient is calm and appropriate on the unit.  Medications given as prescribed.  Safety checks maintained.  Denies suicidal thoughts, auditory and visual hallucinations.  Patient is safe on and off the unit.

## 2020-11-20 NOTE — Progress Notes (Signed)
Recreation Therapy Notes  Date: 4.15.22 Time: 1000 Location: 500 Hallway  Group Topic: Communication, Team Building, Problem Solving  Goal Area(s) Addresses:  Patient will effectively work with peer towards shared goal.  Patient will identify skills used to make activity successful.  Patient will identify how skills used during activity can be used to reach post d/c goals.   Behavioral Response: Engaged, Attentive, Appropriate   Intervention: Teambuilding Activity  Activity: Lily Pad.  Patients were asked to use colored discs to get the entire team from one end of the hall way to the other. Patients were only allowed to move down and back the hallway by stepping on the discs, patient team was provided 1 additional disc to assist with them completing task.    Education: Pharmacist, community, Scientist, physiological, Discharge Planning   Education Outcome: Acknowledges education.   Clinical Observations/Feedback: Pt was active and engaged.  Pt was vocal when needed to offer guidance for the team.  Pt had a hard time understanding the directions at first but caught on when peers explained the rules to him again.  Pt expressed the group used coordination and teamwork.  Pt expressed what he would do differently is "swap out" the positions so he wasn't last in line.     Caroll Rancher, LRT/CTRS     Caroll Rancher A 11/20/2020 10:47 AM

## 2020-11-20 NOTE — Progress Notes (Signed)
[place] continues to [place] .     11/20/20 2200  Psych Admission Type (Psych Patients Only)  Admission Status Involuntary  Psychosocial Assessment  Eye Contact Fair  Facial Expression Anxious (appropriate)  Affect Irritable (WNL)  Speech Logical/coherent  Interaction Assertive  Motor Activity Pacing (steady gait)  Appearance/Hygiene Unremarkable  Behavior Characteristics Cooperative  Mood Preoccupied  Thought Process  Coherency Concrete thinking  Content Blaming others;Delusions  Delusions Grandeur  Perception WDL  Hallucination None reported or observed  Judgment Poor  Confusion None  Danger to Self  Current suicidal ideation? Denies  Danger to Others  Danger to Others None reported or observed

## 2020-11-21 LAB — CBC WITH DIFFERENTIAL/PLATELET
Abs Immature Granulocytes: 0.02 10*3/uL (ref 0.00–0.07)
Basophils Absolute: 0 10*3/uL (ref 0.0–0.1)
Basophils Relative: 1 %
Eosinophils Absolute: 0.1 10*3/uL (ref 0.0–0.5)
Eosinophils Relative: 2 %
HCT: 40.2 % (ref 39.0–52.0)
Hemoglobin: 13.6 g/dL (ref 13.0–17.0)
Immature Granulocytes: 1 %
Lymphocytes Relative: 38 %
Lymphs Abs: 1.5 10*3/uL (ref 0.7–4.0)
MCH: 32.8 pg (ref 26.0–34.0)
MCHC: 33.8 g/dL (ref 30.0–36.0)
MCV: 96.9 fL (ref 80.0–100.0)
Monocytes Absolute: 0.3 10*3/uL (ref 0.1–1.0)
Monocytes Relative: 9 %
Neutro Abs: 2 10*3/uL (ref 1.7–7.7)
Neutrophils Relative %: 49 %
Platelets: 123 10*3/uL — ABNORMAL LOW (ref 150–400)
RBC: 4.15 MIL/uL — ABNORMAL LOW (ref 4.22–5.81)
RDW: 13.9 % (ref 11.5–15.5)
WBC: 3.9 10*3/uL — ABNORMAL LOW (ref 4.0–10.5)
nRBC: 0 % (ref 0.0–0.2)

## 2020-11-21 NOTE — Progress Notes (Signed)
   11/21/20 0814  Psych Admission Type (Psych Patients Only)  Admission Status Involuntary  Psychosocial Assessment  Patient Complaints Worrying  Eye Contact Fair  Facial Expression Anxious  Affect Preoccupied  Speech Tangential  Interaction Assertive  Motor Activity Pacing  Appearance/Hygiene Unremarkable  Behavior Characteristics Cooperative;Appropriate to situation  Mood Preoccupied  Thought Process  Coherency Concrete thinking  Content Blaming others;Delusions  Delusions Grandeur  Perception WDL  Hallucination None reported or observed  Judgment Poor  Confusion None  Danger to Self  Current suicidal ideation? Denies  Danger to Others  Danger to Others None reported or observed  Gordon Martin has been out of his room all day, pacing at times.  He denied any SI/HI or A/V hallucinations.  He is preoccupied with his IVC paperwork and that he does not need to be here.  He continually talks about being tortured detention centers for water violations.  He has rapid loud speech at times but is easily redirectable.  He is pleasant and interacts well with staff and peers.  He mainly has been sitting in the day room writing letters.  He completed his self inventory and reported his depression, hopelessness and anxiety are all 0/10 (10 the worst).  His goal for today is "AH Court/clerk of court to negate the Involuntary Commitment order, placed by my sister, Gordon Martin."  He will accomplish this goal by "Nothing but to relax and let the official rule in my favor."  Q 15 minute checks maintained for safety.  We will continue to monitor the progress towards his goals.

## 2020-11-21 NOTE — Plan of Care (Signed)
  Problem: Health Behavior/Discharge Planning: Goal: Compliance with prescribed medication regimen will improve Outcome: Progressing   Problem: Nutritional: Goal: Ability to achieve adequate nutritional intake will improve Outcome: Progressing   

## 2020-11-21 NOTE — Progress Notes (Signed)
   11/21/20 0500  Sleep  Number of Hours 7.5   

## 2020-11-21 NOTE — Progress Notes (Signed)
Pt continues to express his displeasure with his guardian . Pt visible on the unit , pacing at times. Pt continues to express that the doctor has not contacted the [place] about his medication regimen. Pt stated his guardian has not seen him in 6 months and the doctor is getting info from the guardian which upsets pt. Pt given PRN Trazodone and Vistaril p[er MAR with HS medication    11/21/20 2200  Psych Admission Type (Psych Patients Only)  Admission Status Involuntary  Psychosocial Assessment  Patient Complaints Worrying  Eye Contact Fair  Facial Expression Anxious  Affect Preoccupied  Speech Tangential  Interaction Assertive  Motor Activity Pacing  Appearance/Hygiene Unremarkable  Behavior Characteristics Cooperative;Agitated  Mood Pleasant  Thought Process  Coherency Concrete thinking  Content Blaming others;Delusions  Delusions Grandeur  Perception WDL  Hallucination None reported or observed  Judgment Poor  Confusion None  Danger to Self  Current suicidal ideation? Denies  Danger to Others  Danger to Others None reported or observed
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
## 2020-11-22 NOTE — Progress Notes (Signed)
   11/22/20 0500  Sleep  Number of Hours 8

## 2020-11-22 NOTE — Progress Notes (Signed)
Pt denies SI/HI/AVH. Pt paces hallways intermittently. Pt expresses concern with guardianship and medication regimen stating "I no longer have a guardian".   Student RN administered medications and provided active listening. q 15 minute checks.   Pt is pleasant and spends time in the dayroom. Pt presents no evident distress and is cooperative.

## 2020-11-22 NOTE — Progress Notes (Signed)
   11/22/20 2106  Psych Admission Type (Psych Patients Only)  Admission Status Involuntary  Psychosocial Assessment  Patient Complaints Worrying  Eye Contact Fair  Facial Expression Flat  Affect Preoccupied  Speech Tangential  Interaction Assertive  Motor Activity Other (Comment) (wdl)  Appearance/Hygiene Layered clothes  Behavior Characteristics Cooperative;Restless  Mood Pleasant  Thought Process  Coherency Concrete thinking  Content Blaming others;Delusions  Delusions Grandeur  Perception WDL  Hallucination None reported or observed  Judgment Poor  Confusion None  Danger to Self  Current suicidal ideation? Denies  Danger to Others  Danger to Others None reported or observed  Markian was in the day room all evening.  He attended evening wrap up group.  He reported he had a good day and denied SI/HI or A/V hallucinations.  He denied any pain or discomfort and appeared to be in no physical distress.  He took his hs medications and was given prn vistaril and trazodone with good relief.  He is currently resting with his eyes closed and appears to be asleep.  Q 15 minute checks maintained for safety.  We will continue to monitor the progress towards his goals.

## 2020-11-22 NOTE — Progress Notes (Addendum)
Pt said he feels his guardian has abandoned him over the years and pt feels betrayed because of guardian's behavior.   Pt denies SI/HI/AVH.  Pt feels he does not need to be hospitalized at [facility].   RN established rapport and assessed for needs/concerns.  RN administered medication per provider orders.   Pt is coping well at this time and is in no acute distress.  Pt remains safe on the unit with q 15 min checks in place.

## 2020-11-22 NOTE — Progress Notes (Signed)
Adult Psychoeducational Group Note  Date:  11/22/2020 Time:  11:15 PM  Group Topic/Focus:  Wrap-Up Group:   The focus of this group is to help patients review their daily goal of treatment and discuss progress on daily workbooks.  Participation Level:  Active  Participation Quality:  Appropriate  Affect:  Appropriate  Cognitive:  Appropriate  Insight: Appropriate  Engagement in Group:  Developing/Improving  Modes of Intervention:  Discussion  Additional Comments:  Pt stated his goal for today was to focus on his treatment plan. Pt stated he accomplished his goal today. Pt stated he talked with his doctor but did not get a chance to talk with his  social worker about his care today. Pt rated his overall day a 10. Pt stated been able to go outside with peers improved his day.  Pt stated he made calls to family and friends today. Pt stated he felt better about himself today. Pt stated he was able to attend all meals. Pt stated he took all medications provided today. Pt stated he attend all groups held today.  Pt stated his appetite was pretty good today. Pt rated sleep last night was pretty good. Pt stated the goal tonight was to get some rest. Pt stated he had no physical pain today. Pt deny visual hallucinations and auditory issues tonight.  Pt denies thoughts of harming himself or others. Pt stated he would alert staff if anything changed.   Felipa Furnace 11/22/2020, 11:15 PM

## 2020-11-22 NOTE — Progress Notes (Signed)
Gordon Martin Progress Note  11/22/2020 7:59 AM Gordon Martin  MRN:  [id number]   Chief Complaint: paranoia, mood lability  Subjective:  Gordon Martin is a 55 y.o. male with a history of schizoaffective d/o bipolar type, who was initially admitted for inpatient psychiatric hospitalization on 11/12/2020 for management of violent behaviors, mood lability, and paranoia in the context of medication noncompliance. The patient is currently on Hospital Day 10.   Chart Review from last 24 hours:  The patient's chart was reviewed and nursing notes were reviewed. The patient's case was discussed in multidisciplinary team meeting. Per nursing, patient slept 8 hours. He was visible on the unit but continued to report concern over his guardianship and was preoccupied with IVC status. Per MAR he was compliant with scheduled medications and did receive Vistaril X1 for anxiety and Trazodone X1 for sleep.   Information Obtained Today During Patient Interview: The patient was seen and evaluated on the unit. He is preoccupied today with wanting to get his guardian's phone number to talk with her and perseverates on perceived injustices that he suffered at [facility] and by his sister prior to admission. He was redirectable but is upset that he is hospitalized. He denies SI, HI, ideas of reference, or first rank symptoms. He is evasive when asked about AVH stating "I try to see it spiritually" and making reference to the ability to have predicted 25 years ago what is happening to him presently. He states his constipation is improving but he still has some dry mouth. He has been attending groups and states he is eating and sleeping well. He states he has showered.   Principal Problem: Schizoaffective disorder, bipolar type (HCC) Diagnosis: Principal Problem:   Schizoaffective disorder, bipolar type (HCC)  Total Time Spent in Direct Patient Care:  I personally spent 25 minutes on the unit in direct patient care. The  direct patient care time included face-to-face time with the patient, reviewing the patient's chart, communicating with other professionals, and coordinating care. Greater than 50% of this time was spent in counseling or coordinating care with the patient regarding goals of hospitalization, psycho-education, and discharge planning needs.  Past Psychiatric History: see admission H&P  Past Medical History:  Past Medical History:  Diagnosis Date  . Bipolar disorder (HCC)   . Gout     Family History: see admission H&P  Family Psychiatric  History: see admission H&P  Social History:  Social History   Substance and Sexual Activity  Alcohol Use No     Social History   Substance and Sexual Activity  Drug Use No    Social History   Socioeconomic History  . Marital status: Married    Spouse name: Not on file  . Number of children: Not on file  . Years of education: Not on file  . Highest education level: Not on file  Occupational History  . Not on file  Tobacco Use  . Smoking status: Never Smoker  . Smokeless tobacco: Never Used  Substance and Sexual Activity  . Alcohol use: No  . Drug use: No  . Sexual activity: Not on file  Other Topics Concern  . Not on file  Social History Narrative  . Not on file   Social Determinants of Health   Financial Resource Strain: Not on file  Food Insecurity: Not on file  Transportation Needs: Not on file  Physical Activity: Not on file  Stress: Not on file  Social Connections: Not on file  Sleep: Good  Appetite:  Good  Current Medications: Current Facility-Administered Medications  Medication Dose Route Frequency Provider Last Rate Last Admin  . acetaminophen (TYLENOL) tablet 650 mg  650 mg Oral Q6H PRN [name], Martin      . allopurinol (ZYLOPRIM) tablet 100 mg  100 mg Oral BID [name], Martin   100 mg at 11/21/20 1711  . alum & mag hydroxide-simeth (MAALOX/MYLANTA) 200-200-20 MG/5ML suspension 30 mL  30 mL Oral  Q4H PRN [name], Martin      . antiseptic oral rinse (BIOTENE) solution 15 mL  15 mL Mouth Rinse PRN [name], Marlie Kuennen E, Martin      . divalproex (DEPAKOTE) DR tablet 1,000 mg  1,000 mg Oral QPM [name], Martin   1,000 mg at 11/21/20 1712  . divalproex (DEPAKOTE) DR tablet 750 mg  750 mg Oral Daily [name], Martin   750 mg at 11/21/20 0813  . docusate sodium (COLACE) capsule 100 mg  100 mg Oral Daily [name], Martin   100 mg at 11/21/20 0813  . feeding supplement (ENSURE ENLIVE / ENSURE PLUS) liquid 237 mL  237 mL Oral BID BM [name], Martin   237 mL at 11/21/20 1411  . hydrOXYzine (ATARAX/VISTARIL) tablet 50 mg  50 mg Oral TID PRN [name], Martin   50 mg at 11/21/20 2052  . LORazepam (ATIVAN) tablet 1 mg  1 mg Oral Q6H PRN [name], Martin       Or  . LORazepam (ATIVAN) injection 1 mg  1 mg Intramuscular Q6H PRN [name], Dianey Suchy E, Martin      . magnesium citrate solution 0.5 Bottle  0.5 Bottle Oral Once PRN [name], Courvoisier Hamblen E, Martin      . magnesium hydroxide (MILK OF MAGNESIA) suspension 30 mL  30 mL Oral Daily PRN [name], Martin      . multivitamin with minerals tablet 1 tablet  1 tablet Oral Daily [name], Martin   1 tablet at 11/21/20 [phone]  . omega-3 acid ethyl esters (LOVAZA) capsule 1 g  1 g Oral BID [name], Martin   1 g at 11/21/20 1711  . polyethylene glycol (MIRALAX / GLYCOLAX) packet 17 g  17 g Oral Daily [name], Martin   17 g at 11/21/20 [phone]  . QUEtiapine (SEROQUEL) tablet 800 mg  800 mg Oral QHS [name] E, Martin   800 mg at 11/21/20 2051  . traZODone (DESYREL) tablet 100 mg  100 mg Oral QHS PRN [name], Martin   100 mg at 11/21/20 2052  . Vitamin D3 (Vitamin D) tablet 1,000 Units  1,000 Units Oral Daily [name], Martin   1,000 Units at 11/21/20 [phone]  . ziprasidone (GEODON) injection 20 mg  20 mg Intramuscular Q12H PRN [name], Martin       Lab Results:  Results for orders placed or performed during  the hospital encounter of 11/12/20 (from the past 48 hour(s))  CBC with Differential/Platelet     Status: Abnormal   Collection Time: 11/21/20  6:38 AM  Result Value Ref Range   WBC 3.9 (L) 4.0 - 10.5 K/uL   RBC 4.15 (L) 4.22 - 5.81 MIL/uL   Hemoglobin 13.6 13.0 - 17.0 g/dL   HCT 72.5 36.6 - 44.0 %   MCV 96.9 80.0 - 100.0 fL   MCH 32.8 26.0 - 34.0 pg   MCHC 33.8  30.0 - 36.0 g/dL   RDW 71.0 62.6 - 94.8 %   Platelets 123 (L) 150 - 400 K/uL   nRBC 0.0 0.0 - 0.2 %   Neutrophils Relative % 49 %   Neutro Abs 2.0 1.7 - 7.7 K/uL   Lymphocytes Relative 38 %   Lymphs Abs 1.5 0.7 - 4.0 K/uL   Monocytes Relative 9 %   Monocytes Absolute 0.3 0.1 - 1.0 K/uL   Eosinophils Relative 2 %   Eosinophils Absolute 0.1 0.0 - 0.5 K/uL   Basophils Relative 1 %   Basophils Absolute 0.0 0.0 - 0.1 K/uL   Immature Granulocytes 1 %   Abs Immature Granulocytes 0.02 0.00 - 0.07 K/uL    Comment: Performed at [facility], [address], [place], [place] [address]    Blood Alcohol level:  Lab Results  Component Value Date   [facility] <10 11/11/2020   ETH <5 09/11/2014    Metabolic Disorder Labs: Lab Results  Component Value Date   HGBA1C 5.3 11/18/2020   MPG 105.41 11/18/2020   MPG 102.54 11/16/2020   No results found for: PROLACTIN Lab Results  Component Value Date   CHOL 167 11/18/2020   TRIG 230 (H) 11/18/2020   HDL 33 (L) 11/18/2020   CHOLHDL 5.1 11/18/2020   VLDL 46 (H) 11/18/2020   LDLCALC 88 11/18/2020   LDLCALC 73 11/16/2020    Physical Findings: AIMS: Facial and Oral Movements Muscles of Facial Expression: None, normal Lips and Perioral Area: None, normal Jaw: None, normal Tongue: None, normal,Extremity Movements Upper (arms, wrists, hands, fingers): None, normal Lower (legs, knees, ankles, toes): None, normal, Trunk Movements Neck, shoulders, hips: None, normal, Overall Severity Severity of abnormal movements (highest score from questions above): None,  normal Incapacitation due to abnormal movements: None, normal Patient's awareness of abnormal movements (rate only patient's report): No Awareness, Dental Status Current problems with teeth and/or dentures?: No Does patient usually wear dentures?: No     Musculoskeletal: Strength & Muscle Tone: within normal limits Gait & Station: normal, steady Patient leans: N/A  Psychiatric Specialty Exam: Physical Exam Vitals reviewed.  HENT:     Head: Normocephalic.  Pulmonary:     Effort: Pulmonary effort is normal.  Neurological:     Mental Status: He is alert.     Review of Systems  HENT:       Dry mouth  Respiratory: Negative for shortness of breath.   Cardiovascular: Negative for chest pain.  Gastrointestinal: Positive for constipation. Negative for diarrhea, nausea and vomiting.    Blood pressure 111/71, pulse 83, temperature 98 F (36.7 C), temperature source Oral, resp. rate 18, height 5\' 11"  (1.803 m), weight 88.5 kg, SpO2 100 %.Body mass index is 27.2 kg/m.  General Appearance: wearing scrubs with tank top over, fair hygiene but appears to have washed his hair  Eye Contact:  Good  Speech: Clear and coherent, normal rate  Volume:  Normal  Mood:  irritable  Affect: guarded  Thought Process:  Tangential   Orientation:  Oriented to self, place, and situation  Thought Content: No grandiose delusions but continues to have persecutory delusions about his sister and his guardian; no acute response to internal stimuli noted on exam; denies ideas of reference or first rank symptoms; evasive when questioned about AVH  Suicidal Thoughts:  No  Homicidal Thoughts:  No  Memory:  Recent;   Fair  Judgement:  Impaired  Insight:  Lacking  Psychomotor Activity:  Normal   Concentration:  Improved  Recall:  [employer] of Knowledge:  Fair  Language:  Good  Akathisia:  Negative  Assets:  Communication Skills Desire for Improvement Resilience Social Support  ADL's:  independent   Cognition:  WNL  Sleep:  Number of Hours: 8   Treatment Plan Summary: Diagnoses / Active Problems: Schizoaffective d/o bipolar type by hx Gout HTN Constipation  PLAN: 1. Safety and Monitoring:  -- Involuntary admission to inpatient psychiatric unit for safety, stabilization and treatment  -- Daily contact with patient to assess and evaluate symptoms and progress in treatment  -- Patient's case to be discussed in multi-disciplinary team meeting  -- Observation Level : q15 minute checks  -- Vital signs:  q12 hours  -- Precautions: suicide  2. Psychiatric Diagnoses and Treatment:  Schizoaffective d/o bipolar type by hx -- Continue Seroquel 800mg  po qhs (if not continuing to improve consider cross-taper onto Zyprexa or addition of Li) -- Continue VPA 750mg  qam and 1000mg  po every evening (11/18/20: VPA level 72; AST 25, ALT 22; 11/21/20: WBC 3.9, H/H 13.6/40.2 and platelets 123) will recheck CBC tomorrow and if platelets and WBC not dropping will consider further dose titration up on VPA  -- Continue Vistaril 50mg  tid PRN anxiety -- Continue Omega-3 fatty acids 1g po bid for neuro-protection -- Continue Vit D 3000 units daily, Ensure bid, and MVI daily for nutritional supplementation -- Continue Trazodone 100mg  po qhs PRN insomnia -- Agitation protocol (Ativan 1mg  po or IM q 6 hours PRN agitation and Geodon 20mg  IM q12hrs PRN agitation)  -- Metabolic profile and EKG monitoring obtained while on an atypical antipsychotic (BMI:27.20 Lipid Panel:cholesterol 167, triglycerides 230, HDL 33, LDL 88; HbgA1c:5.3; QTc:440ms and on repeat 11/23/20)   -- Encouraged patient to participate in unit milieu and in scheduled group therapies   -- Short Term Goals: Ability to identify changes in lifestyle to reduce recurrence of condition will improve, Ability to demonstrate self-control will improve and Compliance with prescribed medications will improve  -- Long Term Goals: Improvement in symptoms so as ready  for discharge   High Risk Medication Use: -- The complexity of this patient's case involves drug therapy with Depakote which requires intensive monitoring for toxicity. This is in part due to a narrow therapeutic window as well as potential for toxicity with this medication.    3. Medical Issues Being Addressed:   HTN  -- Monitoring BP off Lasix   Gout  -- Continue Allopurinol 100mg  po bid   Constipation  -- Continue  Colace 100mg  bid and continue Miralax 17g daily; encouraged increased po fluids;  Mag citrate PRN X1 if needed and MOM PRN if needed   Dry Mouth:   -- Biotene PRN ordered   EKG changes   -- Repeat EKG on 11/17/20 shows NSR 68bpm with QTc with questionable age-indeterminate septal infarct  -- Consulted Cardiology (Dr. ) - he reviewed his EKG from 11/17/20 and compared to previous EKGs and does not believe findings represent a septal infarct pattern and he does not see acute ischemic changes or QTc prolongation  -- Repeating EKG for monitoring of QTc on high dose Seroquel   Mild Neutropenia and Thrombocytopenia   -- Continue to monitor while on antipsychotic and VPA - recheck of CBC 11/21/20 shows platelets 123 down from 126 on 11/18/20; WBC 3.9 down from 4.0 on 11/18/20 and ANC 2.0  -- Repeating CBC tomorrow before potential dose adjustments to VPA   Hypertriglyceridemia  -- Is already on Omega  3 fatty acid - will need to be rechecked by PCP after discharge in context of atypical antipsychotic use  4. Discharge Planning:   -- Social work and case management to assist with discharge planning and identification of hospital follow-up needs prior to discharge - housing options and discharge planning still in process per SW and guardian  -- Estimated LOS: TBD  -- Discharge Concerns: Need to establish a safety plan; Medication compliance and effectiveness  -- Discharge Goals: Return home with outpatient referrals for mental health follow-up including medication  management/psychotherapy  Comer LocketAmy E Karolina Zamor, Martin, FAPA 11/22/2020, 7:59 AM

## 2020-11-22 NOTE — Progress Notes (Signed)
RN reviewed Gordon Martin ([employer] student) flowsheets documenation for today.  RN in agreement with Gordon Martin's assessment and pt's plan of care.

## 2020-11-23 LAB — CBC WITH DIFFERENTIAL/PLATELET
Abs Immature Granulocytes: 0.05 10*3/uL (ref 0.00–0.07)
Basophils Absolute: 0.1 10*3/uL (ref 0.0–0.1)
Basophils Relative: 1 %
Eosinophils Absolute: 0.1 10*3/uL (ref 0.0–0.5)
Eosinophils Relative: 2 %
HCT: 41.1 % (ref 39.0–52.0)
Hemoglobin: 14 g/dL (ref 13.0–17.0)
Immature Granulocytes: 1 %
Lymphocytes Relative: 41 %
Lymphs Abs: 1.7 10*3/uL (ref 0.7–4.0)
MCH: 32.8 pg (ref 26.0–34.0)
MCHC: 34.1 g/dL (ref 30.0–36.0)
MCV: 96.3 fL (ref 80.0–100.0)
Monocytes Absolute: 0.4 10*3/uL (ref 0.1–1.0)
Monocytes Relative: 8 %
Neutro Abs: 2 10*3/uL (ref 1.7–7.7)
Neutrophils Relative %: 47 %
Platelets: 135 10*3/uL — ABNORMAL LOW (ref 150–400)
RBC: 4.27 MIL/uL (ref 4.22–5.81)
RDW: 13.9 % (ref 11.5–15.5)
WBC: 4.2 10*3/uL (ref 4.0–10.5)
nRBC: 0 % (ref 0.0–0.2)

## 2020-11-23 NOTE — Progress Notes (Signed)
   11/23/20 1000  Psych Admission Type (Psych Patients Only)  Admission Status Involuntary  Psychosocial Assessment  Patient Complaints Worrying  Eye Contact Fair  Facial Expression Flat  Affect Preoccupied  Speech Tangential  Interaction Assertive  Motor Activity Other (Comment) (wdl)  Appearance/Hygiene Layered clothes  Behavior Characteristics Cooperative  Mood Preoccupied  Thought Process  Coherency Concrete thinking  Content Blaming others;Delusions  Delusions Grandeur  Perception WDL  Hallucination None reported or observed  Judgment Poor  Confusion None  Danger to Self  Current suicidal ideation? Denies  Danger to Others  Danger to Others None reported or observed

## 2020-11-23 NOTE — BHH Counselor (Signed)
CSW spoke with [place] with admissions at [facility]. Per Donnald Garre they will have an open bed for this patient, however it will not be available until the following Tuesday, 11/30/2020.     Ruthann Cancer MSW, LCSW Clincal Social Worker  [facility]

## 2020-11-23 NOTE — Plan of Care (Signed)
  Problem: Education: Goal: Emotional status will improve Outcome: Progressing Goal: Mental status will improve Outcome: Progressing   Problem: Coping: Goal: Will verbalize feelings Outcome: Progressing   Problem: Health Behavior/Discharge Planning: Goal: Compliance with prescribed medication regimen will improve Outcome: Completed/Met

## 2020-11-23 NOTE — Progress Notes (Signed)
[facility] MD Progress Note  11/23/2020 12:30 PM Gordon Martin  MRN:  [id number]   Chief Complaint: paranoia, mood lability  Subjective:  Gordon Martin is a 55 y.o. male with a history of schizoaffective d/o bipolar type, who was initially admitted for inpatient psychiatric hospitalization on 11/12/2020 for management of violent behaviors, mood lability, and paranoia in the context of medication noncompliance. The patient is currently on Hospital Day 11.   Chart Review from last 24 hours:  The patient's chart was reviewed and nursing notes were reviewed. The patient's case was discussed in multidisciplinary team meeting. Per nursing, the patient remained preoccupied with concerns about his guardianship yesterday. He attended groups and was cooperative with staff. No safety concerns noted. Per MAR, he was compliant with scheduled medications and did receive Vistaril X1 for anxiety and Trazodone X1 for sleep.   Information Obtained Today During Patient Interview: The patient was seen and evaluated on the unit. Today he is not wearing scrubs and is instead wearing street clothes. He continues to be ruminative about his discharge planning but is more easily redirectable on exam today. He states his sleep has been adequate and his appetite is good. He reports that his constipation is improving. He voices no physical complaints today. He describes his mood as "the same." He denies AH but states he has "had visual hallucinations for 25 years" and will not comment further on them. He denies ideas of reference or first rank symptoms. When questioned about paranoid thoughts he states that he still believes his guardian and sister are colluding against him. He reports he has tried to reach his guardian on the phone with no answer. He continues to express desire to have housing placement near a [facility] [place] hospital after discharge and was advised that social work is helping look into housing options with his  guardian.  Principal Problem: Schizoaffective disorder, bipolar type (HCC) Diagnosis: Principal Problem:   Schizoaffective disorder, bipolar type (HCC)  Total Time Spent in Direct Patient Care:  I personally spent 25 minutes on the unit in direct patient care. The direct patient care time included face-to-face time with the patient, reviewing the patient's chart, communicating with other professionals, and coordinating care. Greater than 50% of this time was spent in counseling or coordinating care with the patient regarding goals of hospitalization, psycho-education, and discharge planning needs.  Past Psychiatric History: see admission H&P  Past Medical History:  Past Medical History:  Diagnosis Date  . Bipolar disorder (HCC)   . Gout     Family History: see admission H&P  Family Psychiatric  History: see admission H&P  Social History:  Social History   Substance and Sexual Activity  Alcohol Use No     Social History   Substance and Sexual Activity  Drug Use No    Social History   Socioeconomic History  . Marital status: Married    Spouse name: Not on file  . Number of children: Not on file  . Years of education: Not on file  . Highest education level: Not on file  Occupational History  . Not on file  Tobacco Use  . Smoking status: Never Smoker  . Smokeless tobacco: Never Used  Substance and Sexual Activity  . Alcohol use: No  . Drug use: No  . Sexual activity: Not on file  Other Topics Concern  . Not on file  Social History Narrative  . Not on file   Social Determinants of Health   Financial Resource  Strain: Not on file  Food Insecurity: Not on file  Transportation Needs: Not on file  Physical Activity: Not on file  Stress: Not on file  Social Connections: Not on file   Sleep: Good  Appetite:  Good  Current Medications: Current Facility-Administered Medications  Medication Dose Route Frequency Provider Last Rate Last Admin  . acetaminophen  (TYLENOL) tablet 650 mg  650 mg Oral Q6H PRN [name], MD      . allopurinol (ZYLOPRIM) tablet 100 mg  100 mg Oral BID [name], MD   100 mg at 11/23/20 0805  . alum & mag hydroxide-simeth (MAALOX/MYLANTA) 200-200-20 MG/5ML suspension 30 mL  30 mL Oral Q4H PRN [name], MD      . antiseptic oral rinse (BIOTENE) solution 15 mL  15 mL Mouth Rinse PRN [name], Gunhild Bautch E, MD      . divalproex (DEPAKOTE) DR tablet 1,000 mg  1,000 mg Oral QPM [name], MD   1,000 mg at 11/22/20 1710  . divalproex (DEPAKOTE) DR tablet 750 mg  750 mg Oral Daily [name], MD   750 mg at 11/23/20 0804  . docusate sodium (COLACE) capsule 100 mg  100 mg Oral Daily [name], MD   100 mg at 11/23/20 0805  . feeding supplement (ENSURE ENLIVE / ENSURE PLUS) liquid 237 mL  237 mL Oral BID BM [name], MD   237 mL at 11/23/20 0805  . hydrOXYzine (ATARAX/VISTARIL) tablet 50 mg  50 mg Oral TID PRN [name], MD   50 mg at 11/22/20 2106  . LORazepam (ATIVAN) tablet 1 mg  1 mg Oral Q6H PRN [name], MD       Or  . LORazepam (ATIVAN) injection 1 mg  1 mg Intramuscular Q6H PRN [name], Dreyah Montrose E, MD      . magnesium citrate solution 0.5 Bottle  0.5 Bottle Oral Once PRN [name], Loveah Like E, MD      . magnesium hydroxide (MILK OF MAGNESIA) suspension 30 mL  30 mL Oral Daily PRN [name], MD      . multivitamin with minerals tablet 1 tablet  1 tablet Oral Daily [name], MD   1 tablet at 11/23/20 0804  . omega-3 acid ethyl esters (LOVAZA) capsule 1 g  1 g Oral BID [name], MD   1 g at 11/23/20 0804  . polyethylene glycol (MIRALAX / GLYCOLAX) packet 17 g  17 g Oral Daily [name], MD   17 g at 11/23/20 0803  . QUEtiapine (SEROQUEL) tablet 800 mg  800 mg Oral QHS [name] E, MD   800 mg at 11/22/20 2106  . traZODone (DESYREL) tablet 100 mg  100 mg Oral QHS PRN [name], MD   100 mg at 11/22/20 2106  .  Vitamin D3 (Vitamin D) tablet 1,000 Units  1,000 Units Oral Daily [name], MD   1,000 Units at 11/23/20 0805  . ziprasidone (GEODON) injection 20 mg  20 mg Intramuscular Q12H PRN [name], MD       Lab Results:  Results for orders placed or performed during the hospital encounter of 11/12/20 (from the past 48 hour(s))  CBC with Differential/Platelet     Status: Abnormal   Collection Time: 11/23/20  6:33 AM  Result Value Ref Range   WBC 4.2 4.0 - 10.5 K/uL   RBC 4.27 4.22 - 5.81 MIL/uL   Hemoglobin  14.0 13.0 - 17.0 g/dL   HCT 54.6 50.3 - 54.6 %   MCV 96.3 80.0 - 100.0 fL   MCH 32.8 26.0 - 34.0 pg   MCHC 34.1 30.0 - 36.0 g/dL   RDW 56.8 12.7 - 51.7 %   Platelets 135 (L) 150 - 400 K/uL   nRBC 0.0 0.0 - 0.2 %   Neutrophils Relative % 47 %   Neutro Abs 2.0 1.7 - 7.7 K/uL   Lymphocytes Relative 41 %   Lymphs Abs 1.7 0.7 - 4.0 K/uL   Monocytes Relative 8 %   Monocytes Absolute 0.4 0.1 - 1.0 K/uL   Eosinophils Relative 2 %   Eosinophils Absolute 0.1 0.0 - 0.5 K/uL   Basophils Relative 1 %   Basophils Absolute 0.1 0.0 - 0.1 K/uL   Immature Granulocytes 1 %   Abs Immature Granulocytes 0.05 0.00 - 0.07 K/uL    Comment: Performed at [facility], [address], [place], [place] [address]    Blood Alcohol level:  Lab Results  Component Value Date   [facility] <10 11/11/2020   ETH <5 09/11/2014    Metabolic Disorder Labs: Lab Results  Component Value Date   HGBA1C 5.3 11/18/2020   MPG 105.41 11/18/2020   MPG 102.54 11/16/2020   No results found for: PROLACTIN Lab Results  Component Value Date   CHOL 167 11/18/2020   TRIG 230 (H) 11/18/2020   HDL 33 (L) 11/18/2020   CHOLHDL 5.1 11/18/2020   VLDL 46 (H) 11/18/2020   LDLCALC 88 11/18/2020   LDLCALC 73 11/16/2020    Physical Findings: AIMS: Facial and Oral Movements Muscles of Facial Expression: None, normal Lips and Perioral Area: None, normal Jaw: None, normal Tongue: None,  normal,Extremity Movements Upper (arms, wrists, hands, fingers): None, normal Lower (legs, knees, ankles, toes): None, normal, Trunk Movements Neck, shoulders, hips: None, normal, Overall Severity Severity of abnormal movements (highest score from questions above): None, normal Incapacitation due to abnormal movements: None, normal Patient's awareness of abnormal movements (rate only patient's report): No Awareness, Dental Status Current problems with teeth and/or dentures?: No Does patient usually wear dentures?: No     Musculoskeletal: Strength & Muscle Tone: within normal limits Gait & Station: normal, steady Patient leans: N/A  Psychiatric Specialty Exam: Physical Exam Vitals reviewed.  HENT:     Head: Normocephalic.  Pulmonary:     Effort: Pulmonary effort is normal.  Neurological:     Mental Status: He is alert.     Review of Systems  HENT:       Dry mouth  Respiratory: Negative for shortness of breath.   Cardiovascular: Negative for chest pain.  Gastrointestinal: Positive for constipation. Negative for diarrhea, nausea and vomiting.    Blood pressure 96/70, pulse 79, temperature 97.7 F (36.5 C), temperature source Oral, resp. rate 18, height 5\' 11"  (1.803 m), weight 88.5 kg, SpO2 98 %.Body mass index is 27.2 kg/m.  General Appearance: improved hygiene - wearing dress shirt and pants  Eye Contact: Fair  Speech: Clear and coherent, normal rate  Volume:  Normal  Mood: appears less irritable today - described as "the same"  Affect: brighter, less irritable, less guarded  Thought Process:  Tangential but more easily redirectable  Orientation:  Oriented to self, place, and situation  Thought Content: No grandiose delusions but continues to have persecutory delusions about his sister and his guardian; no acute response to internal stimuli noted on exam; denies ideas of reference or first rank  symptoms; denies AH but reports chronic VH but will not discuss content   Suicidal Thoughts:  No  Homicidal Thoughts:  No  Memory:  Recent;   Fair  Judgement:  Impaired  Insight:  Lacking  Psychomotor Activity:  Normal - no tremor, no restlessness  Concentration: Improved  Recall:  [employer] of Knowledge:  Fair  Language:  Good  Akathisia:  Negative  Assets:  Communication Skills Desire for Improvement Resilience Social Support  ADL's:  independent  Cognition:  WNL  Sleep:  Number of Hours: 6.5   Treatment Plan Summary: Diagnoses / Active Problems: Schizoaffective d/o bipolar type by hx Gout HTN Constipation  PLAN: 1. Safety and Monitoring:  -- Involuntary admission to inpatient psychiatric unit for safety, stabilization and treatment  -- Daily contact with patient to assess and evaluate symptoms and progress in treatment  -- Patient's case to be discussed in multi-disciplinary team meeting  -- Observation Level : q15 minute checks  -- Vital signs:  q12 hours  -- Precautions: suicide  2. Psychiatric Diagnoses and Treatment:  Schizoaffective d/o bipolar type by hx -- Continue Seroquel 800mg  po qhs  -- Continue VPA 750mg  qam and 1000mg  po every evening (11/18/20: VPA level 72; AST 25, ALT 22; 11/23/20: WBC 4.2, H/H 14.0/41.1 and platelets 135) holding on further dose increase based on improved presentation today -- Continue Vistaril 50mg  tid PRN anxiety -- Continue Omega-3 fatty acids 1g po bid for neuro-protection -- Continue Vit D 3000 units daily, Ensure bid, and MVI daily for nutritional supplementation -- Continue Trazodone 100mg  po qhs PRN insomnia -- Agitation protocol (Ativan 1mg  po or IM q 6 hours PRN agitation and Geodon 20mg  IM q12hrs PRN agitation)  -- Metabolic profile and EKG monitoring obtained while on an atypical antipsychotic (BMI:27.20 Lipid Panel:cholesterol 167, triglycerides 230, HDL 33, LDL 88; HbgA1c:5.3; QTc:471ms)   -- Encouraged patient to participate in unit milieu and in scheduled group therapies   -- Short Term  Goals: Ability to identify changes in lifestyle to reduce recurrence of condition will improve, Ability to demonstrate self-control will improve and Compliance with prescribed medications will improve  -- Long Term Goals: Improvement in symptoms so as ready for discharge   High Risk Medication Use: -- The complexity of this patient's case involves drug therapy with Depakote which requires intensive monitoring for toxicity. This is in part due to a narrow therapeutic window as well as potential for toxicity with this medication.    3. Medical Issues Being Addressed:   HTN  -- Monitoring BP off Lasix   Gout  -- Continue Allopurinol 100mg  po bid   Constipation  -- Continue  Colace 100mg  bid and continue Miralax 17g daily; encouraged increased po fluids;  Mag citrate PRN X1 if needed and MOM PRN if needed   Dry Mouth:   -- Biotene PRN ordered   EKG changes   -- Repeat EKG on 11/17/20 shows NSR 68bpm with QTc 12-21-1995 with questionable age-indeterminate septal infarct  -- Consulted Cardiology (Dr. ) - he reviewed his EKG from 11/17/20 and compared to previous EKGs and does not believe findings represent a septal infarct pattern and he does not see acute ischemic changes or QTc prolongation  -- Repeat EKG on 11/22/20 shows NSR 67bpm, no significant change since last EKG and QTc   Mild Neutropenia- resolved and Thrombocytopenia- improving  -- Continue to monitor while on antipsychotic and VPA - recheck of CBC 11/23/20 shows platelets increased to 135, WBC 4.2 with  ANC 2000   Hypertriglyceridemia  -- Is already on Omega 3 fatty acid - will need to be rechecked by PCP after discharge in context of atypical antipsychotic use  4. Discharge Planning:   -- Social work and case management to assist with discharge planning and identification of hospital follow-up needs prior to discharge - housing options and discharge planning still in process per SW and guardian  -- Estimated LOS:  TBD  -- Discharge Concerns: Need to establish a safety plan; Medication compliance and effectiveness  -- Discharge Goals: Return home with outpatient referrals for mental health follow-up including medication management/psychotherapy  Comer LocketAmy E Brooklyn Alfredo, MD, FAPA 11/23/2020, 12:30 PM

## 2020-11-23 NOTE — Tx Team (Signed)
Interdisciplinary Treatment and Diagnostic Plan Update  11/23/2020 Time of Session: 9:55am Gordon Martin MRN: [id number]  Principal Diagnosis: Schizoaffective disorder, bipolar type [name])  Secondary Diagnoses: Principal Problem:   Schizoaffective disorder, bipolar type (HCC)   Current Medications:  Current Facility-Administered Medications  Medication Dose Route Frequency Provider Last Rate Last Admin  . acetaminophen (TYLENOL) tablet 650 mg  650 mg Oral Q6H PRN [name], MD      . allopurinol (ZYLOPRIM) tablet 100 mg  100 mg Oral BID [name], MD   100 mg at 11/23/20 0805  . alum & mag hydroxide-simeth (MAALOX/MYLANTA) 200-200-20 MG/5ML suspension 30 mL  30 mL Oral Q4H PRN [name], MD      . antiseptic oral rinse (BIOTENE) solution 15 mL  15 mL Mouth Rinse PRN [name], [name], MD      . divalproex (DEPAKOTE) DR tablet 1,000 mg  1,000 mg Oral QPM [name], MD   1,000 mg at 11/22/20 1710  . divalproex (DEPAKOTE) DR tablet 750 mg  750 mg Oral Daily [name], MD   750 mg at 11/23/20 0804  . docusate sodium (COLACE) capsule 100 mg  100 mg Oral Daily [name], MD   100 mg at 11/23/20 0805  . feeding supplement (ENSURE ENLIVE / ENSURE PLUS) liquid 237 mL  237 mL Oral BID BM [name], MD   237 mL at 11/23/20 0805  . hydrOXYzine (ATARAX/VISTARIL) tablet 50 mg  50 mg Oral TID PRN [name], MD   50 mg at 11/22/20 2106  . LORazepam (ATIVAN) tablet 1 mg  1 mg Oral Q6H PRN [name], MD       Or  . LORazepam (ATIVAN) injection 1 mg  1 mg Intramuscular Q6H PRN [name], [name], MD      . magnesium citrate solution 0.5 Bottle  0.5 Bottle Oral Once PRN [name], [name], MD      . magnesium hydroxide (MILK OF MAGNESIA) suspension 30 mL  30 mL Oral Daily PRN [name], MD      . multivitamin with minerals tablet 1 tablet  1 tablet Oral Daily [name], MD   1 tablet at 11/23/20 0804  . omega-3 acid  ethyl esters (LOVAZA) capsule 1 g  1 g Oral BID [name], MD   1 g at 11/23/20 0804  . polyethylene glycol (MIRALAX / GLYCOLAX) packet 17 g  17 g Oral Daily [name], MD   17 g at 11/23/20 0803  . QUEtiapine (SEROQUEL) tablet 800 mg  800 mg Oral QHS [name] E, MD   800 mg at 11/22/20 2106  . traZODone (DESYREL) tablet 100 mg  100 mg Oral QHS PRN [name], MD   100 mg at 11/22/20 2106  . Vitamin D3 (Vitamin D) tablet 1,000 Units  1,000 Units Oral Daily [name], MD   1,000 Units at 11/23/20 0805  . ziprasidone (GEODON) injection 20 mg  20 mg Intramuscular Q12H PRN [name], MD       PTA Medications: Medications Prior to Admission  Medication Sig Dispense Refill Last Dose  . allopurinol (ZYLOPRIM) 100 MG tablet Take 100 mg by mouth in the morning and at bedtime.   Past Month at Unknown time  . divalproex (DEPAKOTE) 500 MG DR tablet Take 1 tablet (500 mg total) by mouth 2 (two) times daily before a meal. 1 tablet 0 Past  Month at Unknown time  . QUEtiapine (SEROQUEL) 100 MG tablet Take 1 tablet by mouth at bedtime. (Patient not taking: Reported on 11/14/2020)   Not Taking at Unknown time    Patient Stressors: Financial difficulties Health problems Legal issue Marital or family conflict Medication change or noncompliance  Patient Strengths: Average or above average intelligence Capable of independent living Supportive family/friends  Treatment Modalities: Medication Management, Group therapy, Case management,  1 to 1 session with clinician, Psychoeducation, Recreational therapy.   Physician Treatment Plan for Primary Diagnosis: Schizoaffective disorder, bipolar type (HCC) Long Term Goal(s): Improvement in symptoms so as ready for discharge   Short Term Goals: Ability to identify changes in lifestyle to reduce recurrence of condition will improve Ability to demonstrate self-control will improve Compliance with prescribed medications  will improve  Medication Management: Evaluate patient's response, side effects, and tolerance of medication regimen.  Therapeutic Interventions: 1 to 1 sessions, Unit Group sessions and Medication administration.  Evaluation of Outcomes: Progressing  Physician Treatment Plan for Secondary Diagnosis: Principal Problem:   Schizoaffective disorder, bipolar type (HCC)  Long Term Goal(s): Improvement in symptoms so as ready for discharge   Short Term Goals: Ability to identify changes in lifestyle to reduce recurrence of condition will improve Ability to demonstrate self-control will improve Compliance with prescribed medications will improve     Medication Management: Evaluate patient's response, side effects, and tolerance of medication regimen.  Therapeutic Interventions: 1 to 1 sessions, Unit Group sessions and Medication administration.  Evaluation of Outcomes: Progressing   RN Treatment Plan for Primary Diagnosis: Schizoaffective disorder, bipolar type (HCC) Long Term Goal(s): Knowledge of disease and therapeutic regimen to maintain health will improve  Short Term Goals: Ability to participate in decision making will improve, Ability to verbalize feelings will improve and Ability to identify and develop effective coping behaviors will improve  Medication Management: RN will administer medications as ordered by provider, will assess and evaluate patient's response and provide education to patient for prescribed medication. RN will report any adverse and/or side effects to prescribing provider.  Therapeutic Interventions: 1 on 1 counseling sessions, Psychoeducation, Medication administration, Evaluate responses to treatment, Monitor vital signs and CBGs as ordered, Perform/monitor CIWA, COWS, AIMS and Fall Risk screenings as ordered, Perform wound care treatments as ordered.  Evaluation of Outcomes: Progressing   LCSW Treatment Plan for Primary Diagnosis: Schizoaffective disorder,  bipolar type (HCC) Long Term Goal(s): Safe transition to appropriate next level of care at discharge, Engage patient in therapeutic group addressing interpersonal concerns.  Short Term Goals: Engage patient in aftercare planning with referrals and resources, Increase social support and Increase ability to appropriately verbalize feelings  Therapeutic Interventions: Assess for all discharge needs, 1 to 1 time with Social worker, Explore available resources and support systems, Assess for adequacy in community support network, Educate family and significant other(s) on suicide prevention, Complete Psychosocial Assessment, Interpersonal group therapy.  Evaluation of Outcomes: Progressing   Progress in Treatment: Attending groups: Yes. Participating in groups: Yes. Taking medication as prescribed: Yes. Toleration medication: Yes. Family/Significant other contact made: Yes, individual(s) contacted:  [name], LG Patient understands diagnosis: No. Discussing patient identified problems/goals with staff: Yes. Medical problems stabilized or resolved: Yes. Denies suicidal/homicidal ideation: Yes. Issues/concerns per patient self-inventory: No. Other: None  New problem(s) identified: No, Describe:  None  New Short Term/Long Term Goal(s):medication stabilization, elimination of SI thoughts, development of comprehensive mental wellness plan.  Patient Goals:  "move into [place]."  Discharge Plan or Barriers: Patient is  to transfer from [facility] to longterm residential treatment per guardians wishes.   Reason for Continuation of Hospitalization: Delusions  Medication stabilization  Estimated Length of Stay: 3-5 days  Attendees: Patient: 11/13/2020   Physician:  11/13/2020   Nursing:  11/13/2020   RN Care Manager: 11/13/2020   Social Worker: Ruthann Cancer, LCSW  11/13/2020   Recreational Therapist:  11/13/2020   Other:  11/13/2020   Other:  11/13/2020   Other: 11/13/2020      Scribe for  Treatment Team: Otelia Santee, LCSW 11/23/2020 10:52 AM

## 2020-11-23 NOTE — Progress Notes (Signed)
   11/23/20 2145  Psych Admission Type (Psych Patients Only)  Admission Status Involuntary  Psychosocial Assessment  Patient Complaints Worrying  Eye Contact Fair  Facial Expression Flat  Affect Preoccupied  Speech Tangential  Interaction Assertive  Motor Activity Other (Comment) (wdl)  Appearance/Hygiene Unremarkable;In scrubs  Behavior Characteristics Cooperative  Mood Preoccupied;Pleasant  Thought Process  Coherency Concrete thinking  Content Blaming others;Delusions  Delusions Grandeur  Perception WDL  Hallucination None reported or observed  Judgment Poor  Confusion None  Danger to Self  Current suicidal ideation? Denies  Danger to Others  Danger to Others None reported or observed  Gordon Martin has been in the day room much of the evening watching movies with peers.  He is pleasant and cooperative.  He denied any physical complaints or issues with his medications.  He denied SI/HI or A/V hallucinations.  He is concerned about going to [place] and stated "I just want to go the [place], I could always go to the [facility] because they have military doctors there."  Q 15 minute checks maintained for safety.  We will continue to monitor the progress towards his goals.

## 2020-11-23 NOTE — [facility] Group Notes (Signed)
[facility] LCSW Group Therapy  11/23/2020 2:29 PM  Type of Therapy:  Group Therapy  Participation Level:  Active  Participation Quality:  Appropriate, Sharing and Supportive  Affect:  Appropriate  Cognitive:  Alert, Appropriate and Oriented  Insight:  Engaged  Engagement in Therapy:  Engaged  Modes of Intervention:  Discussion  Summary of Progress/Problems: Group topic was social support systems and social circles. Patient engaged in group and discussed how the veterans administration has been a good support for him. Patient discussed how having something in common with peers and social supports can help deepen relationships.  Patient offered support to other group members and was able to participate in discussion and problem solve different ways to grow support system.   Hailee Hollick E Juna Caban 11/23/2020, 2:29 PM

## 2020-11-23 NOTE — Progress Notes (Addendum)
Recreation Therapy Notes  Date: 4.18.22 Time: 1000 Location: 500 Hall Dayroom   Group Topic: Coping Skills   Goal Area(s) Addresses: Patient will define what a coping skill is. Patient will work with peer to create a list of healthy coping skills beginning with each letter of the alphabet. Patient will successfully identify positive coping skills they can use post d/c.  Patient will acknowledge benefit(s) of using learned coping skills post d/c.  Behavioral Response: Engaged   Intervention: Group work   Activity: Coping A to Z.  Patients were given a blank worksheet titled "Coping Skills A-Z" and asked to pair up with a peer. Partners were instructed to come up with at least one positive coping skill per letter of the alphabet, addressing a specific challenge (ex: stress, anger, anxiety, depression, grief, doubt, isolation, self-harm/suicidal thoughts, substance use). Patients were given 15 minutes to brainstorm with their peer, before ideas were presented to the large group. Patients and LRT debriefed on the importance of coping skill selection based on situation and back-up plans when a skill tried is not effective.   Education: Pharmacologist, Scientist, physiological, Discharge Planning.    Education Outcome:  Acknowledges education/Verbalizes understanding/In group clarification offered/Additional education needed   Clinical Observations/Feedback: Pt took on more of the leadership role in his group.  Pt tried to encourage the peers in his group to get more involved in the activity.  Pt ended up working mostly by himself.  Pt was pleasant and attentive to group.  Pt expressed positive coping skills help you deal with situations without doing it in a rivalry way, which helps to not feed the problem. Pt and peers had anger.  Pt was able to come up with coping skills such as take a breath, count 1-10, walk away, conversation with friends and not judging others.    Caroll Rancher,  LRT/CTRS     Lillia Abed, Johnson Arizola A 11/23/2020 11:15 AM

## 2020-11-24 DIAGNOSIS — K5900 Constipation, unspecified: Secondary | ICD-10-CM | POA: Diagnosis not present

## 2020-11-24 DIAGNOSIS — F250 Schizoaffective disorder, bipolar type: Secondary | ICD-10-CM | POA: Diagnosis not present

## 2020-11-24 MED ORDER — NUTRISOURCE FIBER PO PACK
1.0000 | PACK | Freq: Two times a day (BID) | ORAL | Status: DC
  Administered 2020-11-24: 1
  Filled 2020-11-24 (×14): qty 1

## 2020-11-24 MED ORDER — DOCUSATE SODIUM 100 MG PO CAPS
100.0000 mg | ORAL_CAPSULE | Freq: Two times a day (BID) | ORAL | Status: DC
  Administered 2020-11-24 – 2020-12-01 (×14): 100 mg via ORAL
  Filled 2020-11-24: qty 1
  Filled 2020-11-24: qty 10
  Filled 2020-11-24 (×11): qty 1
  Filled 2020-11-24: qty 10
  Filled 2020-11-24 (×5): qty 1

## 2020-11-24 MED ORDER — POLYETHYLENE GLYCOL 3350 17 G PO PACK
17.0000 g | PACK | Freq: Every day | ORAL | Status: DC | PRN
  Administered 2020-11-25: 17 g via ORAL
  Filled 2020-11-24: qty 3

## 2020-11-24 MED ORDER — DIVALPROEX SODIUM 500 MG PO DR TAB
1000.0000 mg | DELAYED_RELEASE_TABLET | Freq: Two times a day (BID) | ORAL | Status: DC
  Administered 2020-11-24 – 2020-12-01 (×14): 1000 mg via ORAL
  Filled 2020-11-24: qty 20
  Filled 2020-11-24 (×6): qty 2
  Filled 2020-11-24: qty 20
  Filled 2020-11-24 (×12): qty 2

## 2020-11-24 NOTE — Progress Notes (Signed)
Adult Psychoeducational Group Note  Date:  11/24/2020 Time:  3:06 AM  Group Topic/Focus:  Wrap-Up Group:   The focus of this group is to help patients review their daily goal of treatment and discuss progress on daily workbooks.  Participation Level:  Active  Participation Quality:  Appropriate  Affect:  Anxious and Irritable  Cognitive:  Confused  Insight: Appropriate  Engagement in Group:  Limited  Modes of Intervention:  Discussion  Additional Comments:  Pt stated his goal for today was to focus on his treatment plan. Pt stated he accomplished his goal today. Pt stated he talked with his doctor and his social worker about his care today. Pt rated his overall day an 5 out out of10. Pt voice concerns about his social worker finding aftercare place in [place].  Pt stated he made calls no calls today. Pt stated he felt better about himself today. Pt stated he was able to attend all meals. Pt stated he took all medications provided today. Pt stated he attend all groups held today.Pt stated his appetite was pretty good today. Pt rated sleep last night was pretty good. Pt stated the goal tonight was to get some rest. Pt stated he had no physical pain today. Ptdenyvisual hallucinations andauditory issues tonight.  Pt denies thoughts of harming himself or others. Pt stated he would alert staff if anything changed.   Felipa Furnace 11/24/2020, 3:06 AM

## 2020-11-24 NOTE — Progress Notes (Signed)
Recreation Therapy Notes  Date: 4.19.22 Time: 1000 Location: 500 Hall Dayroom  Group Topic: Communication  Goal Area(s) Addresses:  Patient will effectively communicate with peers in group.  Patient will verbalize benefit of healthy communication. Patient will verbalize positive effect of healthy communication on post d/c goals.  Patient will identify communication techniques that made activity effective for group.   Behavioral Response: Engaged  Intervention: Blank paper, Pencils, Geometric Pictures  Activity: Geometric Drawings.  There were three volunteers for the activity.  Each volunteer would describe a picture to the rest of the group.  The rest of the group had to draw the picture how it was described to them.  If they missed any of the instructions, they could only ask the presenter to repeat themselves.  Once the the patients finished their drawings, the would compare it to the original to see how close they came to the original picture.       Education: Communication, Discharge Planning  Education Outcome: Acknowledges understanding/In group clarification offered/Needs additional education.   Clinical Observations/Feedback:  Pt identified some ways of communicating as e-mail, Internet and letters.  Pt was constantly asking questions of the other presenters.  Pt also had to be reminded to follow the directions of just asking the presenter to repeat themselves.  Pt peers felt he did a pretty good job describing his picture.  Pt expressed the hardest part of the activity was trying to visualize the picture and angles of the shapes.  Pt was appropriate and engaged during group.    Caroll Rancher, LRT/CTRS    Lillia Abed, Laray Rivkin A 11/24/2020 11:08 AM

## 2020-11-24 NOTE — Progress Notes (Signed)
[facility] MD Progress Note  11/24/2020 1:40 PM Gordon Martin  MRN:  [id number]   Chief Complaint: paranoia, mood lability  Subjective:  Gordon Martin is a 55 y.o. male with a history of schizoaffective d/o bipolar type, who was initially admitted for inpatient psychiatric hospitalization on 11/12/2020 for management of violent behaviors, mood lability, and paranoia in the context of medication noncompliance. The patient is currently on Hospital Day 12.   Chart Review from last 24 hours:  The patient's chart was reviewed and nursing notes were reviewed. The patient's case was discussed in multidisciplinary team meeting. Per nursing, the patient remained preoccupied with concerns about his guardianship. He attended groups and was cooperative with staff. No safety concerns noted. Per MAR, he was compliant with scheduled medications and did receive Vistaril for anxiety and Trazodone for sleep.   Information Obtained Today During Patient Interview: The patient was seen and evaluated on the unit. Today he is wearing street clothes.  Patient today is perseverative about wanting to be transferred to a [place], but is able to understand that other facilities are currently on diversion.  He believes his guardian wants to take him to [place], and he is unwilling to go to [place].  He discusses that he has had a guardian since 04/2020 during a competency hearing.  He is fixated on being able to schedule another competency hearing so that he can be resumed back to being his own guardian.  He states that he is 100% service connected and wishes to receive treatment at a [place] hospital.  He states that he most recently was treated for an inpatient psychiatric admission at the [facility], after which she was in prison where he had complications of feet swelling.  Patient's speech is tangential, and he quickly reverts back to his ruminations of mistreatment while in prison and psychiatric care.  Patient is able to have a more  logical conversation regarding his medications.  He complains that he continues to have difficulty with constipation which is slowly improving.  He voices no other complaints today.  He does continue to believe that guardian and sister are colluding against him.  Had a conference with patient and social work to discuss living facilities patient could discharge to.  Patient states he previously lived in a senior assisted living, which he believes he still qualifies for.  Social work is agreeable to looking to see if there are beds available at this facility.  Principal Problem: Schizoaffective disorder, bipolar type (HCC) Diagnosis: Principal Problem:   Schizoaffective disorder, bipolar type (HCC)  Total Time Spent in Direct Patient Care:  I personally spent 40 minutes on the unit in direct patient care. The direct patient care time included face-to-face time with the patient, reviewing the patient's chart, communicating with other professionals, and coordinating care. Greater than 50% of this time was spent in counseling or coordinating care with the patient regarding goals of hospitalization, psycho-education, and discharge planning needs.  Past Psychiatric History: see admission H&P  Past Medical History:  Past Medical History:  Diagnosis Date  . Bipolar disorder (HCC)   . Gout     Family History: see admission H&P  Family Psychiatric  History: see admission H&P  Social History:  Social History   Substance and Sexual Activity  Alcohol Use No     Social History   Substance and Sexual Activity  Drug Use No    Social History   Socioeconomic History  . Marital status: Married  Spouse name: Not on file  . Number of children: Not on file  . Years of education: Not on file  . Highest education level: Not on file  Occupational History  . Not on file  Tobacco Use  . Smoking status: Never Smoker  . Smokeless tobacco: Never Used  Substance and Sexual Activity  . Alcohol use: No   . Drug use: No  . Sexual activity: Not on file  Other Topics Concern  . Not on file  Social History Narrative  . Not on file   Social Determinants of Health   Financial Resource Strain: Not on file  Food Insecurity: Not on file  Transportation Needs: Not on file  Physical Activity: Not on file  Stress: Not on file  Social Connections: Not on file   Sleep: Good-patient improved sleep has significantly improved with medication adjustments.  Appetite:  Good-patient enjoys food at the facility  Current Medications: Current Facility-Administered Medications  Medication Dose Route Frequency Provider Last Rate Last Admin  . acetaminophen (TYLENOL) tablet 650 mg  650 mg Oral Q6H PRN [name], MD      . allopurinol (ZYLOPRIM) tablet 100 mg  100 mg Oral BID [name], MD   100 mg at 11/24/20 0758  . alum & mag hydroxide-simeth (MAALOX/MYLANTA) 200-200-20 MG/5ML suspension 30 mL  30 mL Oral Q4H PRN [name], MD      . antiseptic oral rinse (BIOTENE) solution 15 mL  15 mL Mouth Rinse PRN [name], [name], MD      . divalproex (DEPAKOTE) DR tablet 1,000 mg  1,000 mg Oral QPM [name], MD   1,000 mg at 11/23/20 1814  . divalproex (DEPAKOTE) DR tablet 750 mg  750 mg Oral Daily [name], MD   750 mg at 11/24/20 0758  . docusate sodium (COLACE) capsule 100 mg  100 mg Oral Daily [name], MD   100 mg at 11/24/20 0758  . feeding supplement (ENSURE ENLIVE / ENSURE PLUS) liquid 237 mL  237 mL Oral BID BM [name], MD   237 mL at 11/24/20 0946  . hydrOXYzine (ATARAX/VISTARIL) tablet 50 mg  50 mg Oral TID PRN [name], MD   50 mg at 11/23/20 2101  . LORazepam (ATIVAN) tablet 1 mg  1 mg Oral Q6H PRN [name], MD       Or  . LORazepam (ATIVAN) injection 1 mg  1 mg Intramuscular Q6H PRN [name], [name], MD      . magnesium citrate solution 0.5 Bottle  0.5 Bottle Oral Once PRN [name], [name], MD      . magnesium  hydroxide (MILK OF MAGNESIA) suspension 30 mL  30 mL Oral Daily PRN [name], MD      . multivitamin with minerals tablet 1 tablet  1 tablet Oral Daily [name], MD   1 tablet at 11/24/20 0758  . omega-3 acid ethyl esters (LOVAZA) capsule 1 g  1 g Oral BID [name], MD   1 g at 11/24/20 0759  . polyethylene glycol (MIRALAX / GLYCOLAX) packet 17 g  17 g Oral Daily [name], MD   17 g at 11/24/20 0758  . QUEtiapine (SEROQUEL) tablet 800 mg  800 mg Oral QHS [name] E, MD   800 mg at 11/23/20 2101  . traZODone (DESYREL) tablet 100 mg  100 mg Oral QHS PRN [name], MD  100 mg at 11/23/20 2101  . Vitamin D3 (Vitamin D) tablet 1,000 Units  1,000 Units Oral Daily Antonieta Pertlary, Greg Lawson, MD   1,000 Units at 11/24/20 0758  . ziprasidone (GEODON) injection 20 mg  20 mg Intramuscular Q12H PRN Comer LocketSingleton, [name], MD       Lab Results:  Results for orders placed or performed during the hospital encounter of 11/12/20 (from the past 48 hour(s))  CBC with Differential/Platelet     Status: Abnormal   Collection Time: 11/23/20  6:33 AM  Result Value Ref Range   WBC 4.2 4.0 - 10.5 K/uL   RBC 4.27 4.22 - 5.81 MIL/uL   Hemoglobin 14.0 13.0 - 17.0 g/dL   HCT 16.141.1 09.639.0 - 04.552.0 %   MCV 96.3 80.0 - 100.0 fL   MCH 32.8 26.0 - 34.0 pg   MCHC 34.1 30.0 - 36.0 g/dL   RDW 40.913.9 81.111.5 - 91.415.5 %   Platelets 135 (L) 150 - 400 K/uL   nRBC 0.0 0.0 - 0.2 %   Neutrophils Relative % 47 %   Neutro Abs 2.0 1.7 - 7.7 K/uL   Lymphocytes Relative 41 %   Lymphs Abs 1.7 0.7 - 4.0 K/uL   Monocytes Relative 8 %   Monocytes Absolute 0.4 0.1 - 1.0 K/uL   Eosinophils Relative 2 %   Eosinophils Absolute 0.1 0.0 - 0.5 K/uL   Basophils Relative 1 %   Basophils Absolute 0.1 0.0 - 0.1 K/uL   Immature Granulocytes 1 %   Abs Immature Granulocytes 0.05 0.00 - 0.07 K/uL    Comment: Performed at [facility], [address] [place], [place] [address]    Blood Alcohol  level:  Lab Results  Component Value Date   [facility] 11/11/2020   ETH <5 09/11/2014    Metabolic Disorder Labs: Lab Results  Component Value Date   HGBA1C 5.3 11/18/2020   MPG 105.41 11/18/2020   MPG 102.54 11/16/2020   No results found for: PROLACTIN Lab Results  Component Value Date   CHOL 167 11/18/2020   TRIG 230 (H) 11/18/2020   HDL 33 (L) 11/18/2020   CHOLHDL 5.1 11/18/2020   VLDL 46 (H) 11/18/2020   LDLCALC 88 11/18/2020   LDLCALC 73 11/16/2020    Physical Findings: AIMS: Facial and Oral Movements Muscles of Facial Expression: None, normal Lips and Perioral Area: None, normal Jaw: None, normal Tongue: None, normal,Extremity Movements Upper (arms, wrists, hands, fingers): None, normal Lower (legs, knees, ankles, toes): None, normal, Trunk Movements Neck, shoulders, hips: None, normal, Overall Severity Severity of abnormal movements (highest score from questions above): None, normal Incapacitation due to abnormal movements: None, normal Patient's awareness of abnormal movements (rate only patient's report): No Awareness, Dental Status Current problems with teeth and/or dentures?: No Does patient usually wear dentures?: No     Musculoskeletal: Strength & Muscle Tone: within normal limits Gait & Station: normal, steady Patient leans: N/A  Psychiatric Specialty Exam: Physical Exam Vitals reviewed.  Constitutional:      Appearance: Normal appearance.  HENT:     Head: Normocephalic.  Eyes:     Extraocular Movements: Extraocular movements intact.  Cardiovascular:     Rate and Rhythm: Normal rate.  Pulmonary:     Effort: Pulmonary effort is normal.  Neurological:     General: No focal deficit present.     Mental Status: He is alert and oriented to person, place, and time.     Review of Systems  HENT:  Dry mouth  Respiratory: Negative for shortness of breath.   Cardiovascular: Negative for chest pain.  Gastrointestinal: Positive for constipation.  Negative for diarrhea, nausea and vomiting.  Psychiatric/Behavioral: Negative for sleep disturbance and suicidal ideas.    Blood pressure 134/77, pulse 85, temperature (!) 97.5 F (36.4 C), temperature source Oral, resp. rate 18, height 5\' 11"  (1.803 m), weight 88.5 kg, SpO2 100 %.Body mass index is 27.2 kg/m.  General Appearance:  wearing dress shirt and pants, hair is greasy  Eye Contact: Good  Speech: Clear and coherent, normal rate  Volume:  Normal  Mood: States, "feels good"  Affect: Congruent  Thought Process:  Tangential but redirectable  Orientation:  Oriented to self, place, time and situation  Thought Content: No grandiose delusions but continues to have persecutory delusions about his sister and his guardian; no acute response to internal stimuli noted on exam; denies ideas of reference or first rank symptoms; denies AVH today  Suicidal Thoughts:  No  Homicidal Thoughts:  No  Memory:  Immediate;   Fair Recent;   Fair Remote;   Fair  Judgement:  Impaired  Insight:  Lacking  Psychomotor Activity:  Normal - no tremor, no restlessness  Concentration: Improved  Recall:  of Knowledge:  Fair  Language:  Good  Akathisia:  Negative  Assets:  Communication Skills Desire for Improvement Resilience Social Support  ADL's:  independent  Cognition:  WNL  Sleep:  Number of Hours: 8   Treatment Plan Summary: Diagnoses / Active Problems: Schizoaffective d/o bipolar type by hx Gout HTN Constipation  PLAN: 1. Safety and Monitoring:  -- Involuntary admission to inpatient psychiatric unit for safety, stabilization and treatment  -- Daily contact with patient to assess and evaluate symptoms and progress in treatment  -- Patient's case to be discussed in multi-disciplinary team meeting  -- Observation Level : q15 minute checks  -- Vital signs:  q12 hours  -- Precautions: suicide  2. Psychiatric Diagnoses and Treatment:  Schizoaffective d/o bipolar type by hx --  Continue Seroquel 800mg  po qhs  --Change VPA from 750mg  qam to 1000 mg every morning and 1000mg  po every evening (11/18/20: VPA level 72; AST 25, ALT 22; 11/23/20: WBC 4.2, H/H 14.0/41.1 and platelets 135)  -- Continue Vistaril 50mg  tid PRN anxiety -- Continue Omega-3 fatty acids 1g po bid for neuro-protection -- Continue Vit D 3000 units daily, Ensure bid, and MVI daily for nutritional supplementation -- Continue Trazodone 100mg  po qhs PRN insomnia -- Agitation protocol (Ativan 1mg  po or IM q 6 hours PRN agitation and Geodon 20mg  IM q12hrs PRN agitation)  -- Metabolic profile and EKG monitoring obtained while on an atypical antipsychotic (BMI:27.20 Lipid Panel:cholesterol 167, triglycerides 230, HDL 33, LDL 88; HbgA1c:5.3; QTc:429ms)   -- Encouraged patient to participate in unit milieu and in scheduled group therapies   -- Short Term Goals: Ability to identify changes in lifestyle to reduce recurrence of condition will improve, Ability to demonstrate self-control will improve and Compliance with prescribed medications will improve  -- Long Term Goals: Improvement in symptoms so as ready for discharge   High Risk Medication Use: -- The complexity of this patient's case involves drug therapy with Depakote which requires intensive monitoring for toxicity. This is in part due to a narrow therapeutic window as well as potential for toxicity with this medication.    3. Medical Issues Being Addressed:   HTN  -- Monitoring BP off Lasix, has remained stable   Gout  --  Continue Allopurinol 100mg  po bid   Constipation  --Increase Colace 100 mg twice daily with breakfast and dinner; start fiber packet once daily with dinner; change MiraLAX 17 g daily as needed  --encouraged increased po fluids;    --Mag citrate PRN X1 if needed and MOM PRN if needed   Dry Mouth:   -- Biotene PRN ordered   EKG changes   -- Repeat EKG on 11/17/20 shows NSR 68bpm with QTc 01/17/21 with questionable age-indeterminate  septal infarct  -- Consulted Cardiology (Dr. ) - he reviewed his EKG from 11/17/20 and compared to previous EKGs and does not believe findings represent a septal infarct pattern and he does not see acute ischemic changes or QTc prolongation  -- Repeat EKG on 11/22/20 shows NSR 67bpm, no significant change since last EKG and QTc 11/24/20   Mild Neutropenia- resolved and Thrombocytopenia- improving  -- Continue to monitor while on antipsychotic and VPA - recheck of CBC 11/23/20 shows platelets increased to 135, WBC 4.2 with ANC 2000   Hypertriglyceridemia  -- Is already on Omega 3 fatty acid - will need to be rechecked by PCP after discharge in context of atypical antipsychotic use  4. Discharge Planning:   -- Social work and case management to assist with discharge planning and identification of hospital follow-up needs prior to discharge - housing options and discharge planning still in process per SW and guardian  -- Estimated LOS: TBD  -- Discharge Concerns: Need to establish a safety plan; Medication compliance and effectiveness  -- Discharge Goals: Return home with outpatient referrals for mental health follow-up including medication management/psychotherapy  11/25/20, MD, FAPA 11/24/2020, 1:40 PM

## 2020-11-24 NOTE — [facility] Group Notes (Signed)
[facility] Group Notes:  (Nursing/MHT/Case Management/Adjunct)  Date:  11/24/2020  Time:  3:31 PM  Type of Therapy:  Group Therapy  Participation Level:  Active  Participation Quality:  Appropriate and Attentive  Affect:  Appropriate  Cognitive:  Alert and Appropriate  Insight:  Appropriate and Good  Engagement in Group:  Engaged  Modes of Intervention:  Orientation  Summary of Progress/Problems: His goal [facility] and to take his medications that are prescribed by the doctor.   Deetra Booton J Krystelle Prashad 11/24/2020, 3:31 PM

## 2020-11-24 NOTE — Plan of Care (Signed)
  Problem: Education: Goal: Mental status will improve Outcome: Progressing   Problem: Education: Goal: Knowledge of [place] [facility] Education information/materials will improve Outcome: Completed/Met Goal: Verbalization of understanding the information provided will improve Outcome: Completed/Met   Problem: Role Relationship: Goal: Ability to interact with others will improve Outcome: Completed/Met

## 2020-11-24 NOTE — BHH Counselor (Addendum)
CSW spoke with Dondra Prader with [facility] senior living. Per Dondra Prader they have several different floor plans and as long as this pt chooses a floor plan that has availability he could potentially move in within a day if finances are confirmed and move-in fee/rent can be paid upfront. Prices of apartments range from $2425 to $4335 monthly.   CSW attempted to reach pt's guardian to discuss this option; voicemail was left.    CSW spoke with Carlota Raspberry who stated that [facility] may be a valid option if they are able to monitor this patients medications.   CSW will not move forward with placement until guardian provides approval.   Ruthann Cancer MSW, LCSW Clincal Social Worker  [facility]

## 2020-11-25 DIAGNOSIS — F250 Schizoaffective disorder, bipolar type: Secondary | ICD-10-CM | POA: Diagnosis not present

## 2020-11-25 DIAGNOSIS — K5900 Constipation, unspecified: Secondary | ICD-10-CM | POA: Diagnosis not present

## 2020-11-25 MED ORDER — NUTRISOURCE FIBER PO PACK: 1.0000 | PACK | Freq: Two times a day (BID) | ORAL | 0 refills | Status: DC

## 2020-11-25 MED ORDER — ALLOPURINOL 100 MG PO TABS: 100.0000 mg | ORAL_TABLET | Freq: Two times a day (BID) | ORAL | 0 refills | Status: AC

## 2020-11-25 MED ORDER — POLYETHYLENE GLYCOL 3350 17 G PO PACK: 17.0000 g | PACK | Freq: Every day | ORAL | 0 refills | Status: DC | PRN

## 2020-11-25 MED ORDER — TRAZODONE HCL 100 MG PO TABS: 100.0000 mg | ORAL_TABLET | Freq: Every evening | ORAL | 0 refills | Status: AC | PRN

## 2020-11-25 MED ORDER — VITAMIN D3 25 MCG PO TABS: 1000.0000 [IU] | ORAL_TABLET | Freq: Every day | ORAL | 0 refills | Status: AC

## 2020-11-25 MED ORDER — DIVALPROEX SODIUM 500 MG PO DR TAB: 1000.0000 mg | DELAYED_RELEASE_TABLET | Freq: Two times a day (BID) | ORAL | 0 refills | Status: AC

## 2020-11-25 MED ORDER — OMEGA-3-ACID ETHYL ESTERS 1 G PO CAPS: 1.0000 g | ORAL_CAPSULE | Freq: Two times a day (BID) | ORAL | 0 refills | Status: AC

## 2020-11-25 MED ORDER — ADULT MULTIVITAMIN W/MINERALS CH: 1.0000 | ORAL_TABLET | Freq: Every day | ORAL | 0 refills | Status: AC

## 2020-11-25 MED ORDER — QUETIAPINE FUMARATE 400 MG PO TABS: 800.0000 mg | ORAL_TABLET | Freq: Every day | ORAL | 0 refills | Status: AC

## 2020-11-25 MED ORDER — BIOTENE DRY MOUTH MT LIQD: 15.0000 mL | OROMUCOSAL | 0 refills | Status: DC | PRN

## 2020-11-25 MED ORDER — ENSURE ENLIVE PO LIQD: 237.0000 mL | Freq: Two times a day (BID) | ORAL | 12 refills | Status: DC

## 2020-11-25 MED ORDER — DOCUSATE SODIUM 100 MG PO CAPS: 100.0000 mg | ORAL_CAPSULE | Freq: Two times a day (BID) | ORAL | 0 refills | Status: AC

## 2020-11-25 MED ORDER — HYDROXYZINE HCL 50 MG PO TABS: 50.0000 mg | ORAL_TABLET | Freq: Three times a day (TID) | ORAL | 0 refills | Status: AC | PRN

## 2020-11-25 NOTE — Progress Notes (Addendum)
   11/25/20 2100  Psych Admission Type (Psych Patients Only)  Admission Status Involuntary  Psychosocial Assessment  Patient Complaints Worrying;Anxiety  Eye Contact Fair  Facial Expression Other (Comment) (appropriate)  Affect Preoccupied;Anxious  Speech Logical/coherent  Interaction Assertive  Motor Activity Other (Comment) (wnl)  Appearance/Hygiene Unremarkable  Behavior Characteristics Cooperative  Mood Pleasant;Anxious  Thought Process  Coherency Concrete thinking  Content Blaming others  Delusions None reported or observed  Perception WDL  Hallucination None reported or observed  Judgment Limited  Confusion None  Danger to Self  Current suicidal ideation? Denies  Danger to Others  Danger to Others None reported or observed   Pt seen in dayroom. Pt denies SI, HI, AVH and pain. Pt rates anxiety 8/10 d/t preparing for discharge today but discharge was halted d/t DSS guardian lodging an appeal. "I was prepared to leave at fifteen minutes to five. But, then there was an appeal - so we'll see what happens now." Pt is calm. Pt takes meds as prescribed.

## 2020-11-25 NOTE — Progress Notes (Signed)
   11/24/20 2051  Psych Admission Type (Psych Patients Only)  Admission Status Involuntary  Psychosocial Assessment  Patient Complaints Worrying  Eye Contact Fair  Facial Expression Flat  Affect Preoccupied  Speech Tangential  Interaction Assertive  Motor Activity Other (Comment) (WDL)  Appearance/Hygiene Unremarkable  Behavior Characteristics Cooperative  Mood Preoccupied;Pleasant  Thought Process  Coherency Concrete thinking  Content Blaming others;Delusions  Delusions Grandeur  Perception WDL  Hallucination None reported or observed  Judgment Poor  Confusion None  Danger to Self  Current suicidal ideation? Denies  Danger to Others  Danger to Others None reported or observed  [facility] has been pleasant and cooperative.  Noted sitting in the day room watching TV.  He denied SI/HI or AVH. He was quiet this evening.  He voiced no physical complaints and appeared to be in no physical distress.  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  He took his HS medications and is currently asleep.  Q 15 minute checks maintained for safety.  We will continue to monitor the progress towards his goals.

## 2020-11-25 NOTE — BHH Counselor (Signed)
CSW spoke with this patients guardian, [name] [phone]) who stated that this pt is not able to discharge to [place] due to being unsafe to live in independent apartment at this time. She states that this senior living does not offer the amount of monitoring that this patient needs. She states this would be an unsafe discharge for this patients safety and for others.     [facility] now has an opening on Friday and could discharge door to door with them.      Ruthann Cancer MSW, LCSW Clincal Social Worker  [facility]

## 2020-11-25 NOTE — BHH Group Notes (Signed)
LCSW Group Therapy Note   11/25/2020 1:15pm   Type of Therapy and Topic:  Group Therapy:  Positive Affirmations   Participation Level:  Active  Description of Group: This group addressed positive affirmation toward self and others. Patients went around the room and identified two positive things about themselves and two positive things about a peer in the room. Patients reflected on how it felt to share something positive with others, to identify positive things about themselves, and to hear positive things from others. Patients were encouraged to have a daily reflection of positive characteristics or circumstances.  Therapeutic Goals 1. Patient will verbalize two of their positive qualities 2. Patient will demonstrate empathy for others by stating two positive qualities about a peer in the group 3. Patient will verbalize their feelings when voicing positive self affirmations and when voicing positive affirmations of others 4. Patients will discuss the potential positive impact on their wellness/recovery of focusing on positive traits of self and others.  Summary of Patient Progress: Patient attended and participated in group.    Therapeutic Modalities Cognitive Behavioral Therapy Motivational Interviewing  Otelia Santee, LCSW 11/25/2020 1:32 PM
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
## 2020-11-25 NOTE — BHH Group Notes (Signed)
BHH Group Notes:  (Nursing/MHT/Case Management/Adjunct)  Date:  11/25/2020  Time:  10:49 AM  Type of Therapy:  Group Therapy  Participation Level:  Active  Participation Quality:  Appropriate  Affect:  Appropriate  Cognitive:  Alert and Appropriate  Insight:  Appropriate and Good  Engagement in Group:  Engaged  Modes of Intervention:  Orientation  Summary of Progress/Problems: His goal is to get into assisted living or one of the [place] hospitals.   [name] 11/25/2020, 10:49 AM

## 2020-11-25 NOTE — Progress Notes (Signed)
Adult Psychoeducational Group Note  Date:  11/25/2020 Time:  9:20 PM  Group Topic/Focus:  Wrap-Up Group:   The focus of this group is to help patients review their daily goal of treatment and discuss progress on daily workbooks.  Participation Level:  Active  Participation Quality:  Appropriate  Affect:  Anxious and Appropriate  Cognitive:  Appropriate  Insight: Appropriate  Engagement in Group:  Developing/Improving  Modes of Intervention:  Discussion  Additional Comments:  Pt stated his goal for today was to focus on his treatment plan and discuss with his discharge with his doctor. Pt stated he accomplished his goal today. Pt stated he talked with his doctorand his social worker about his care today. Pt rated his overall day an 8 out out of10. Pt stated he was agitated because of the issues surrounding his discharge. Pt stated he made calls to his aftercare placement today. Pt stated he felt better about himself today. Pt stated he was able to attend all meals. Pt stated he took all medications provided today. Pt stated he attend all groups held today.Pt stated his appetite was pretty good today. Pt rated sleep last night was pretty good. Pt stated the goal tonight was to get some rest. Pt stated he had no physical pain today. Ptdenyvisual hallucinations andauditory issues tonight. Pt denies thoughts of harming himself or others. Pt stated he would alert staff if anything changed.  Felipa Furnace 11/25/2020, 9:20 PM

## 2020-11-25 NOTE — BHH Counselor (Signed)
CSW just received information from guardian that this patients discharge has been appealed through [employer]. Case ID is [id number].    CSW will complete HINN 12 and Detailed Notice of D/C letter for appeals process.     Ruthann Cancer MSW, LCSW Clincal Social Worker  [facility]

## 2020-11-25 NOTE — Progress Notes (Signed)
[facility] MD Progress Note  11/25/2020 6:03 PM Gordon Martin  MRN:  [id number]   Chief Complaint: paranoia, mood lability  Subjective:  Gordon Martin is a 55 y.o. male with a history of schizoaffective d/o bipolar type, who was initially admitted for inpatient psychiatric hospitalization on 11/12/2020 for management of violent behaviors, mood lability, and paranoia in the context of medication noncompliance. The patient is currently on Hospital Day 13.   Chart Review from last 24 hours:  The patient's chart was reviewed and nursing notes were reviewed. The patient's case was discussed in multidisciplinary team meeting. Per nursing, the patient remained preoccupied with concerns about his guardianship. He attended groups and was cooperative with staff. No safety concerns noted. Per MAR, he was compliant with scheduled medications and did receive Vistaril for anxiety and Trazodone for sleep.   Information Obtained Today During Patient Interview: The patient was seen and evaluated on the unit. Today he is wearing hospital scrubs. Patient today is calm and cooperative.  He states that he realizes that he has to follow through with the plan by his guardian.  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  He recognizes that he did this in a moment of frustration when he realized that his belongings had been moved from his apartment.  He states that he has felt isolated as people from his family were not communicating with him.  However he states that he now has been having good communication with his nephew.  He would like to find out what happened to the contents of his apartment.  More specifically his close and computers so that he can have them shipped to where he will be living in [place].  He also requests that he lives near a [facility] in order that he can continue to get his BA prescription medications.  His  He reiterates that he  is 100% service connected and wishes to receive treatment at a [place] [facility].  Patient is denying any suicidal or homicidal ideation today.  He denies auditory or visual hallucinations.  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 A peer-to-peer was completed with [facility].  Patient's coverage was approved through 11/24/2020.  Given patient's sustained improvement in the affect of this patient, continued report of euthymic mood, repeated denial of suicidal, homicidal, and other violent ideation, adequate interaction with peers, active participation in groups while on the unit, and denial of adverse reactions from medications, the treatment team decided Gordon Martin was stable for discharge home.  Discharge paperwork was completed as well as discharge orders.  Patient's DSS social worker was initially in agreement, however an expedited appeal was made on guardian's behalf to keep patient admitted to the hospital.  Patient's discharge is currently on hold due to appeal by DSS guardian.  Patient expresses concerns regarding finances for extended hospital stay, stating he does not wish to pay out of pocket due to the appeal process.  Patient visibly disappointed that his discharge fell through, but he was able to remain calm.  He had a phone meeting with a caregiver at his new facility in [place], which went very well.  Principal Problem: Schizoaffective disorder, bipolar type (HCC) Diagnosis: Principal Problem:   Schizoaffective disorder, bipolar type (HCC) Active Problems:   Constipation  Total Time Spent in Direct Patient Care:  I personally spent 65  minutes on the unit in direct patient care. The direct patient care time included face-to-face time with the patient, reviewing the patient's chart, communicating with other professionals, peer-to-peer review and coordinating care and discharge paperwork. Greater than 50% of this time was spent in  counseling or coordinating care with the patient regarding goals of hospitalization, psycho-education, and discharge planning needs.  Past Psychiatric History: see admission H&P  Past Medical History:  Past Medical History:  Diagnosis Date  . Bipolar disorder (HCC)   . Gout     Family History: see admission H&P  Family Psychiatric  History: see admission H&P  Social History:  Social History   Substance and Sexual Activity  Alcohol Use No     Social History   Substance and Sexual Activity  Drug Use No    Social History   Socioeconomic History  . Marital status: Married    Spouse name: Not on file  . Number of children: Not on file  . Years of education: Not on file  . Highest education level: Not on file  Occupational History  . Not on file  Tobacco Use  . Smoking status: Never Smoker  . Smokeless tobacco: Never Used  Substance and Sexual Activity  . Alcohol use: No  . Drug use: No  . Sexual activity: Not on file  Other Topics Concern  . Not on file  Social History Narrative  . Not on file   Social Determinants of Health   Financial Resource Strain: Not on file  Food Insecurity: Not on file  Transportation Needs: Not on file  Physical Activity: Not on file  Stress: Not on file  Social Connections: Not on file   Sleep: Good-patient improved sleep has significantly improved with medication adjustments.  Appetite:  Good-patient enjoys food at the facility  Current Medications: Current Facility-Administered Medications  Medication Dose Route Frequency Provider Last Rate Last Admin  . acetaminophen (TYLENOL) tablet 650 mg  650 mg Oral Q6H PRN [name], MD      . allopurinol (ZYLOPRIM) tablet 100 mg  100 mg Oral BID [name], MD   100 mg at 11/25/20 1711  . alum & mag hydroxide-simeth (MAALOX/MYLANTA) 200-200-20 MG/5ML suspension 30 mL  30 mL Oral Q4H PRN [name], MD      . antiseptic oral rinse (BIOTENE) solution 15 mL  15 mL  Mouth Rinse PRN [name], [name], MD      . divalproex (DEPAKOTE) DR tablet 1,000 mg  1,000 mg Oral BID AC & HS Mariel Craft, MD   1,000 mg at 11/25/20 0940  . docusate sodium (COLACE) capsule 100 mg  100 mg Oral BID Mariel Craft, MD   100 mg at 11/25/20 1711  . feeding supplement (ENSURE ENLIVE / ENSURE PLUS) liquid 237 mL  237 mL Oral BID BM [name], MD   237 mL at 11/25/20 1423  . fiber (NUTRISOURCE FIBER) 1 packet  1 packet Per Tube BID Mariel Craft, MD   1 packet at 11/24/20 1656  . hydrOXYzine (ATARAX/VISTARIL) tablet 50 mg  50 mg Oral TID PRN [name], MD   50 mg at 11/24/20 2051  . LORazepam (ATIVAN) tablet 1 mg  1 mg Oral Q6H PRN [name], MD       Or  . LORazepam (ATIVAN) injection 1 mg  1 mg Intramuscular Q6H PRN [name], [name], MD      . magnesium citrate solution 0.5 Bottle  0.5 Bottle Oral Once PRN Mason JimSingleton, [name], MD      . magnesium hydroxide (MILK OF MAGNESIA) suspension 30 mL  30 mL Oral Daily PRN Antonieta Pertlary, Greg Lawson, MD      . multivitamin with minerals tablet 1 tablet  1 tablet Oral Daily Antonieta Pertlary, Greg Lawson, MD   1 tablet at 11/25/20 0940  . omega-3 acid ethyl esters (LOVAZA) capsule 1 g  1 g Oral BID Antonieta Pertlary, Greg Lawson, MD   1 g at 11/25/20 1711  . polyethylene glycol (MIRALAX / GLYCOLAX) packet 17 g  17 g Oral Daily PRN Mariel CraftMaurer, Kadarious Dikes M, MD   17 g at 11/25/20 [phone]  . QUEtiapine (SEROQUEL) tablet 800 mg  800 mg Oral QHS Bartholomew CrewsSingleton, [name], MD   800 mg at 11/24/20 2051  . traZODone (DESYREL) tablet 100 mg  100 mg Oral QHS PRN Antonieta Pertlary, Greg Lawson, MD   100 mg at 11/24/20 2051  . Vitamin D3 (Vitamin D) tablet 1,000 Units  1,000 Units Oral Daily Antonieta Pertlary, Greg Lawson, MD   1,000 Units at 11/25/20 0940  . ziprasidone (GEODON) injection 20 mg  20 mg Intramuscular Q12H PRN Comer LocketSingleton, [name], MD       Lab Results:  No results found for this or any previous visit (from the past 48 hour(s)).  Blood Alcohol level:  Lab Results  Component Value  Date   ETH <10 11/11/2020   ETH <5 09/11/2014    Metabolic Disorder Labs: Lab Results  Component Value Date   HGBA1C 5.3 11/18/2020   MPG 105.41 11/18/2020   MPG 102.54 11/16/2020   No results found for: PROLACTIN Lab Results  Component Value Date   CHOL 167 11/18/2020   TRIG 230 (H) 11/18/2020   HDL 33 (L) 11/18/2020   CHOLHDL 5.1 11/18/2020   VLDL 46 (H) 11/18/2020   LDLCALC 88 11/18/2020   LDLCALC 73 11/16/2020    Physical Findings: AIMS: Facial and Oral Movements Muscles of Facial Expression: None, normal Lips and Perioral Area: None, normal Jaw: None, normal Tongue: None, normal,Extremity Movements Upper (arms, wrists, hands, fingers): None, normal Lower (legs, knees, ankles, toes): None, normal, Trunk Movements Neck, shoulders, hips: None, normal, Overall Severity Severity of abnormal movements (highest score from questions above): None, normal Incapacitation due to abnormal movements: None, normal Patient's awareness of abnormal movements (rate only patient's report): No Awareness, Dental Status Current problems with teeth and/or dentures?: No Does patient usually wear dentures?: No     Musculoskeletal: Strength & Muscle Tone: within normal limits Gait & Station: normal, steady Patient leans: N/A  Psychiatric Specialty Exam: Physical Exam Vitals reviewed.  Constitutional:      Appearance: Normal appearance.  HENT:     Head: Normocephalic.  Eyes:     Extraocular Movements: Extraocular movements intact.  Cardiovascular:     Rate and Rhythm: Normal rate.  Pulmonary:     Effort: Pulmonary effort is normal.  Neurological:     General: No focal deficit present.     Mental Status: He is alert and oriented to person, place, and time.     Review of Systems  HENT:       Dry mouth  Respiratory: Negative for shortness of breath.   Cardiovascular: Negative for chest pain.  Gastrointestinal: Negative for constipation, diarrhea, nausea and vomiting.   Psychiatric/Behavioral: Negative for agitation, behavioral problems, confusion, decreased concentration, dysphoric mood, hallucinations, self-injury, sleep disturbance and suicidal ideas. The patient is not nervous/anxious and is not hyperactive.  Blood pressure 110/87, pulse 80, temperature 98.8 F (37.1 C), temperature source Oral, resp. rate 18, height  (1.803 m), weight 88.5 kg, SpO2 98 %.Body mass index is 27.2 kg/m.  General Appearance:  wearing scrubs for majority of day.  Patient did change into a history close for discharge, but then changed back to scrubs.  Eye Contact: Good  Speech: Clear and coherent, normal rate  Volume:  Normal  Mood: "Okay", disappointment  Affect: Congruent  Thought Process: Circumstantial but redirectable  Orientation:  Oriented to self, place, time and situation  Thought Content: No grandiose delusions, persecutory delusions about his sister and his guardian are less and redirectable; no acute response to internal stimuli noted on exam; denies ideas of reference or first rank symptoms; denies AVH today  Suicidal Thoughts:  No  Homicidal Thoughts:  No  Memory:  Immediate;   Fair Recent;   Fair Remote;   Fair  Judgement:  Impaired  Insight:  Lacking  Psychomotor Activity:  Normal - no tremor, no restlessness  Concentration: Improved  Recall:  [employer] of Knowledge:  Fair  Language:  Good  Akathisia:  Negative  Assets:  Communication Skills Desire for Improvement Resilience Social Support  ADL's:  independent  Cognition:  WNL  Sleep:  Number of Hours: 7.75   Treatment Plan Summary: Diagnoses / Active Problems: Schizoaffective d/o bipolar type by hx Gout HTN Constipation  PLAN: 1. Safety and Monitoring:  -- Involuntary admission to inpatient psychiatric unit for safety, stabilization and treatment  -- Daily contact with patient to assess and evaluate symptoms and progress in treatment  -- Patient's case to be discussed in  multi-disciplinary team meeting  -- Observation Level : q15 minute checks  -- Vital signs:  q12 hours  -- Precautions: suicide  2. Psychiatric Diagnoses and Treatment:  Schizoaffective d/o bipolar type by hx -- Continue Seroquel  po qhs  --Continue Depakote ER 1000 mg every morning and  po every evening (11/18/20: VPA level 72; AST 25, ALT 22; 11/23/20: WBC 4.2, H/H 14.0/41.1 and platelets 135)  -- Continue Vistaril  tid PRN anxiety -- Continue Omega-3 fatty acids 1g po bid for neuro-protection -- Continue Vit D 3000 units daily, Ensure bid, and MVI daily for nutritional supplementation -- Continue Trazodone  po qhs PRN insomnia -- Agitation protocol (Ativan  po or IM q 6 hours PRN agitation and Geodon  IM q12hrs PRN agitation)  -- Metabolic profile and EKG monitoring obtained while on an atypical antipsychotic (BMI:27.20 Lipid Panel:cholesterol 167, triglycerides 230, HDL 33, LDL 88; HbgA1c:5.3; QTc:478ms)   -- Encouraged patient to participate in unit milieu and in scheduled group therapies   -- Short Term Goals: Ability to identify changes in lifestyle to reduce recurrence of condition will improve, Ability to demonstrate self-control will improve and Compliance with prescribed medications will improve  -- Long Term Goals: Improvement in symptoms so as ready for discharge   High Risk Medication Use: -- The complexity of this patient's case involves drug therapy with Depakote which requires intensive monitoring for toxicity. This is in part due to a narrow therapeutic window as well as potential for toxicity with this medication.    3. Medical Issues Being Addressed:   HTN  -- Monitoring BP and swelling off Lasix, has remained stable   Gout  -- Continue Allopurinol  po bid   Constipation  -- Continue Colace 100 mg twice daily with breakfast and dinner;  -- Continue fiber packet once daily with dinner;  change MiraLAX 17 g daily as needed  --encouraged  increased po fluids;    --Mag citrate PRN X1 if needed and MOM PRN if needed   Dry Mouth:   -- Biotene PRN ordered   EKG changes   -- Repeat EKG on 11/17/20 shows NSR 68bpm with QTc with questionable age-indeterminate septal infarct  -- Consulted Cardiology (Dr. Izora Ribas) - he reviewed his EKG from 11/17/20 and compared to previous EKGs and does not believe findings represent a septal infarct pattern and he does not see acute ischemic changes or QTc prolongation  -- Repeat EKG on 11/22/20 shows NSR 67bpm, no significant change since last EKG and QTc   Mild Neutropenia- resolved and Thrombocytopenia- improving  -- Continue to monitor while on antipsychotic and VPA - recheck of CBC 11/23/20 shows platelets increased to 135, WBC 4.2 with ANC 2000   Hypertriglyceridemia  -- Is already on Omega 3 fatty acid - will need to be rechecked by PCP after discharge in context of atypical antipsychotic use  4. Discharge Planning:   -- Social work and case management to assist with discharge planning and identification of hospital follow-up needs prior to discharge - housing options and discharge planning still in process per SW and guardian  -- Estimated LOS: TBD  -- Discharge Concerns: Need to establish a safety plan; Medication compliance and effectiveness  -- Discharge Goals: Return home with outpatient referrals for mental health follow-up including medication management/psychotherapy  Mariel Craft, MD 11/25/2020, 6:03 PM

## 2020-11-25 NOTE — Progress Notes (Signed)
Recreation Therapy Notes  Date: 4.20.22 Time: 1000 Location: 500 Hall Dayroom  Group Topic: Leisure Education  Goal Area(s) Addresses:  Patient will identify positive leisure and recreation activities.  Patient will identify one positive benefit of participation in leisure activities.   Behavioral Response: Engaged  Intervention: Group Game  Activity: Leisure IT trainer. Patients took turns drawing a picture on the white board while the rest of the group tried to guess what the picture was.  The person drawing could not talk or write words on the board.  Patients got one minute to guess the drawing, the person who gets the correct answer gets the next turn.  Education:  Teacher, English as a foreign language, Special educational needs teacher, Discharge Planning  Education Outcome: Acknowledges education/In group clarification offered/Needs additional education  Clinical Observations/Feedback: Pt was observant at first and more quiet than usual.  Pt opened up more as the activity went on.  Pt became brighter and seemed to be enjoying himself.    Caroll Rancher, LRT/CTRS     Caroll Rancher A 11/25/2020 12:03 PM

## 2020-11-25 NOTE — Progress Notes (Signed)
Pt observed to be agitated on phone related to issues surrounding discharge "I'm not not going out of state if I don't have no money. I can't leave here just like that". Rates his anxiety, depression and hopelessness all 0/10. Pt's speech is tangential and pressured which seems to be his baseline. He is very cooperative with current medication regimen without adverse drug reactions and unit routines. Attends all scheduled groups and is actively engaged in activities on and off unit. Q 15 minutes safety checks maintained. All medications given as ordered and effects monitored. Support and encouragement provided to pt.

## 2020-11-25 NOTE — Care Management Important Message (Signed)
Medicare IM given to Meredith Rumbley, LCSW to give to the patient.  

## 2020-11-26 LAB — RESP PANEL BY RT-PCR (FLU A&B, COVID) ARPGX2
Influenza A by PCR: NEGATIVE
Influenza B by PCR: NEGATIVE
SARS Coronavirus 2 by RT PCR: NEGATIVE

## 2020-11-26 NOTE — Progress Notes (Addendum)
   11/26/20 2038  Psych Admission Type (Psych Patients Only)  Admission Status Involuntary  Psychosocial Assessment  Patient Complaints Anxiety  Eye Contact Fair  Facial Expression Other (Comment) (appropriate)  Affect Preoccupied;Anxious  Speech Logical/coherent  Interaction Assertive  Motor Activity Other (Comment) (wnl)  Appearance/Hygiene Unremarkable  Behavior Characteristics Cooperative  Mood Pleasant  Thought Process  Coherency Concrete thinking  Content Blaming others  Delusions None reported or observed  Perception WDL  Hallucination None reported or observed  Judgment Limited  Confusion None  Danger to Self  Current suicidal ideation? Denies  Danger to Others  Danger to Others None reported or observed   Pt seen interacting in dayroom. Pt says his anxiety is 9/10 d/t issues with the appeal made by his DSS guardian. "They gave me this paper here that shows where they are in the appeals process." Pt says he just wants to be discharged. Pt appears calm and is cooperative. Pt denies SI, HI, AVH and pain. Says that constipation issue has resolved. Takes medications as prescribed.

## 2020-11-26 NOTE — Progress Notes (Signed)
   11/26/20 1000  Psych Admission Type (Psych Patients Only)  Admission Status Involuntary  Psychosocial Assessment  Patient Complaints Worrying  Eye Contact Fair  Facial Expression Other (Comment) (appropriate)  Affect Preoccupied;Anxious  Speech Logical/coherent  Interaction Assertive  Motor Activity Other (Comment) (wnl)  Appearance/Hygiene Unremarkable  Behavior Characteristics Cooperative  Mood Pleasant  Thought Process  Coherency Concrete thinking  Content Blaming others  Delusions None reported or observed  Perception WDL  Hallucination None reported or observed  Judgment Limited  Confusion None  Danger to Self  Current suicidal ideation? Denies  Danger to Others  Danger to Others None reported or observed

## 2020-11-26 NOTE — Progress Notes (Signed)
Adult Psychoeducational Group Note  Date:  11/26/2020 Time:  8:39 PM  Group Topic/Focus:  Wrap-Up Group:   The focus of this group is to help patients review their daily goal of treatment and discuss progress on daily workbooks.  Participation Level:  Active  Participation Quality:  Appropriate  Affect:  Anxious and Irritable  Cognitive:  Appropriate  Insight: Appropriate  Engagement in Group:  Engaged  Modes of Intervention:  Discussion  Additional Comments: Pt stated his goal for today was to focus on his treatment plan and discuss with his discharge with his doctor. Pt stated he accomplished his goals today. Pt stated he talked with his doctorand hissocial worker about his care today. Pt rated his overall day an 8 out out of10. Pt stated he was agitated because of the issues surrounding his discharge.Pt stated he made calls to a family friend, and his girlfriend today which improved his overall day.Pt stated he felt better about himself today. Pt stated he was able to attend all meals. Pt stated he took all medications provided today. Pt stated he attend all groups held today.Pt stated his appetite was pretty good today. Pt rated sleep last night was pretty good. Pt stated the goal tonight was to get some rest. Pt stated he had no physical pain today. Ptdenyvisual hallucinations andauditory issues tonight. Pt denies thoughts of harming himself or others. Pt stated he would alert staff if anything changed.   Felipa Furnace 11/26/2020, 8:39 PM

## 2020-11-26 NOTE — Progress Notes (Signed)
Recreation Therapy Notes  Date: 4.21.22 Time: 1000 Location: 500 Hall Dayroom   Group Topic: Self Esteem    Goal Area(s) Addresses:  Patient will appropriately identify what self esteem is.  Patient will create a shield of armor describing themselves.  Patient will successfully identify positive attributes about themselves.    Behavioral Response: Preoccupied  Intervention / Activity: Self-Esteem Shield. Patient attended a recreation therapy group session focused on self esteem. Patient identified what self esteem is, and why it is important to have high self esteem during group discussion. LRT described to patients that their shield was to reflect the things that make them unique.  Patients could highlight accomplishments, people/pets that are important to them, things they have yet to accomplish, etc.   Patients were provided sheets with the shield printed on them and colored pencils, markers and crayons to complete the activity. LRT also played music for patients as they worked on their shields.  Education: Self esteem, Communication, Positive self-talk, Discharge Planning   Education Outcome: Acknowledges education/Verbalizes understanding of Education/In group clarification offered/Needs further education   Comments: Pt was appropriate and social with peers.  Pt was preoccupied with getting discharged because he mentioned getting an appeal reversed so he could get out of here.  Pt shield was completely about him wanting to discharge.     Caroll Rancher, LRT/CTRS     Caroll Rancher A 11/26/2020 11:23 AM

## 2020-11-26 NOTE — Care Management Important Message (Signed)
[employer] appeal submitted, waiting on determination status.

## 2020-11-26 NOTE — Progress Notes (Signed)
[facility] MD Progress Note  11/26/2020 2:47 PM Gordon Martin  MRN:  [id number]   Subjective: Patient states his mood is "always good" today and he reports eagerness for discharge.  Patient reports dry mouth and constipation but he denies other medication side effects and states medications have been helpful for sleep.  He is sleeping and eating well.  The patient denies passive wish for death, SI, AI, HI, AH or VH.  The patient talks about his family stealing his belongings and expresses dissatisfaction with his guardians plan for patient to go to [place] after discharge.  Patient states his belief that guardian wants patient to use up his personal funds in order to deplete them by sending him to [place].  Objective: Medical record reviewed.  Patient's case discussed in detail with members of the treatment team today.  I met with and interviewed the patient this afternoon on the unit.  Patient is appropriately engaged in conversation with me.  He maintains good eye contact.  There are no apparent motor abnormalities.  Speech is of normal rate and amount.  Mood is "always good."  Affect is generally congruent although patient appears a bit irritable about his discharge being delayed by his guardian.  Processes are superficially coherent and goal-directed.  Thought content contains some paranoid persecutory themes about select family members and guardian being against patient and are trying to get his belongings or funds.  These appear to be less intense than as documented earlier during patient's hospital course.  Patient denies perceptual abnormalities and does not appear to respond to internal stimuli.  He is alert and oriented.  Attention and concentration are fair.  Insight is limited.  Judgment is limited.  Staff report and chart review, patient has not engaged in any self-injurious or aggressive behavior on the unit.  The patient has been taking standing dose medications as prescribed except for his fiber  supplement.  The patient took PRN trazodone last night for sleep and PRN hydroxyzine last night for anxiety.  Principal Problem: Schizoaffective disorder, bipolar type ([name]) Diagnosis: Principal Problem:   Schizoaffective disorder, bipolar type ([name]) Active Problems:   Constipation  Total Time spent with patient: 20 minutes  Past Psychiatric History: See H&P  Past Medical History:  Past Medical History:  Diagnosis Date  . Bipolar disorder ([name])   . Gout    History reviewed. No pertinent surgical history. Family History: History reviewed. No pertinent family history. Family Psychiatric  History: See H&P Social History:  Social History   Substance and Sexual Activity  Alcohol Use No     Social History   Substance and Sexual Activity  Drug Use No    Social History   Socioeconomic History  . Marital status: Married    Spouse name: Not on file  . Number of children: Not on file  . Years of education: Not on file  . Highest education level: Not on file  Occupational History  . Not on file  Tobacco Use  . Smoking status: Never Smoker  . Smokeless tobacco: Never Used  Substance and Sexual Activity  . Alcohol use: No  . Drug use: No  . Sexual activity: Not on file  Other Topics Concern  . Not on file  Social History Narrative  . Not on file   Social Determinants of Health   Financial Resource Strain: Not on file  Food Insecurity: Not on file  Transportation Needs: Not on file  Physical Activity: Not on file  Stress: Not  on file  Social Connections: Not on file   Additional Social History:                         Sleep: Good  Appetite:  Good  Current Medications: Current Facility-Administered Medications  Medication Dose Route Frequency Provider Last Rate Last Admin  . acetaminophen (TYLENOL) tablet 650 mg  650 mg Oral Q6H PRN Sharma Covert, MD      . allopurinol (ZYLOPRIM) tablet 100 mg  100 mg Oral BID Sharma Covert, MD   100 mg at  11/26/20 0758  . alum & mag hydroxide-simeth (MAALOX/MYLANTA) 200-200-20 MG/5ML suspension 30 mL  30 mL Oral Q4H PRN Sharma Covert, MD      . antiseptic oral rinse (BIOTENE) solution 15 mL  15 mL Mouth Rinse PRN Nelda Marseille, [name], MD      . divalproex (DEPAKOTE) DR tablet 1,000 mg  1,000 mg Oral BID AC & HS Lavella Hammock, MD   1,000 mg at 11/26/20 0758  . docusate sodium (COLACE) capsule 100 mg  100 mg Oral BID Lavella Hammock, MD   100 mg at 11/26/20 0757  . feeding supplement (ENSURE ENLIVE / ENSURE PLUS) liquid 237 mL  237 mL Oral BID BM Sharma Covert, MD   237 mL at 11/26/20 1043  . fiber (NUTRISOURCE FIBER) 1 packet  1 packet Per Tube BID Lavella Hammock, MD   1 packet at 11/24/20 1656  . hydrOXYzine (ATARAX/VISTARIL) tablet 50 mg  50 mg Oral TID PRN Sharma Covert, MD   50 mg at 11/25/20 2038  . LORazepam (ATIVAN) tablet 1 mg  1 mg Oral Q6H PRN Harlow Asa, MD       Or  . LORazepam (ATIVAN) injection 1 mg  1 mg Intramuscular Q6H PRN Nelda Marseille, [name], MD      . magnesium citrate solution 0.5 Bottle  0.5 Bottle Oral Once PRN Nelda Marseille, [name], MD      . magnesium hydroxide (MILK OF MAGNESIA) suspension 30 mL  30 mL Oral Daily PRN Sharma Covert, MD      . multivitamin with minerals tablet 1 tablet  1 tablet Oral Daily Sharma Covert, MD   1 tablet at 11/26/20 0757  . omega-3 acid ethyl esters (LOVAZA) capsule 1 g  1 g Oral BID Sharma Covert, MD   1 g at 11/26/20 0758  . polyethylene glycol (MIRALAX / GLYCOLAX) packet 17 g  17 g Oral Daily PRN Lavella Hammock, MD   17 g at 11/25/20 [phone]  . QUEtiapine (SEROQUEL) tablet 800 mg  800 mg Oral QHS Nelda Marseille, [name], MD   800 mg at 11/25/20 2039  . traZODone (DESYREL) tablet 100 mg  100 mg Oral QHS PRN Sharma Covert, MD   100 mg at 11/25/20 2038  . Vitamin D3 (Vitamin D) tablet 1,000 Units  1,000 Units Oral Daily Sharma Covert, MD   1,000 Units at 11/26/20 [phone]  . ziprasidone (GEODON) injection 20 mg  20 mg  Intramuscular Q12H PRN Harlow Asa, MD        Lab Results: No results found for this or any previous visit (from the past 48 hour(s)).  Blood Alcohol level:  Lab Results  Component Value Date   [facility] <10 11/11/2020   ETH <5 50/93/2671    Metabolic Disorder Labs: Lab Results  Component Value Date   HGBA1C 5.3 11/18/2020  MPG 105.41 11/18/2020   MPG 102.54 11/16/2020   No results found for: PROLACTIN Lab Results  Component Value Date   CHOL 167 11/18/2020   TRIG 230 (H) 11/18/2020   HDL 33 (L) 11/18/2020   CHOLHDL 5.1 11/18/2020   VLDL 46 (H) 11/18/2020   LDLCALC 88 11/18/2020   LDLCALC 73 11/16/2020    Physical Findings: AIMS: Facial and Oral Movements Muscles of Facial Expression: None, normal Lips and Perioral Area: None, normal Jaw: None, normal Tongue: None, normal,Extremity Movements Upper (arms, wrists, hands, fingers): None, normal Lower (legs, knees, ankles, toes): None, normal, Trunk Movements Neck, shoulders, hips: None, normal, Overall Severity Severity of abnormal movements (highest score from questions above): None, normal Incapacitation due to abnormal movements: None, normal Patient's awareness of abnormal movements (rate only patient's report): No Awareness, Dental Status Current problems with teeth and/or dentures?: No Does patient usually wear dentures?: No  CIWA:    COWS:     Musculoskeletal: Strength & Muscle Tone: within normal limits Gait & Station: normal Patient leans: N/A  Psychiatric Specialty Exam:  Presentation  General Appearance: Casual  Eye Contact:Good  Speech:Clear and Coherent  Speech Volume:Normal  Handedness:Right   Mood and Affect  Mood:Euthymic ("Always good")  Affect:Congruent   Thought Process  Thought Processes:Coherent; Other (comment) (Circumstantial but redirectable)  Descriptions of Associations:Circumstantial  Orientation:Full (Time, Place and Person)  Thought Content:Paranoid  Ideation  History of Schizophrenia/Schizoaffective disorder:Yes  Duration of Psychotic Symptoms:Greater than six months  Hallucinations:Hallucinations: None  Ideas of Reference:Paranoia; Percusatory (Continues with paranoid persecutory themes about select family members and guardian but of decreased intensity per chart review)  Suicidal Thoughts:Suicidal Thoughts: No  Homicidal Thoughts:Homicidal Thoughts: No   Sensorium  Memory:Immediate Fair; Recent Fair; Remote Fair  Judgment:Poor  Insight:Shallow   Executive Functions  Concentration:Fair  Attention Span:Fair  [place]  Language:Good   Psychomotor Activity  Psychomotor Activity:Psychomotor Activity: Normal   Assets  Assets:Desire for Improvement; Armed forces logistics/support/administrative officer; Resilience   Sleep  Sleep:Sleep: Good    Physical Exam: Physical Exam Vitals and nursing note reviewed.  HENT:     Head: Normocephalic.  Neurological:     General: No focal deficit present.     Mental Status: He is alert and oriented to person, place, and time.    Review of Systems  Constitutional: Negative for diaphoresis and fever.  HENT: Negative for hearing loss.        Positive for dry mouth  Eyes: Negative for blurred vision.  Respiratory: Negative for cough.   Cardiovascular: Negative for chest pain.  Gastrointestinal: Positive for constipation. Negative for nausea and vomiting.  Genitourinary: Negative for dysuria.  Musculoskeletal: Negative for myalgias.  Neurological: Negative for dizziness and tremors.  Psychiatric/Behavioral: Negative for depression, hallucinations and suicidal ideas. The patient does not have insomnia.    Blood pressure 98/83, pulse (!) 103, temperature 98.2 F (36.8 C), temperature source Oral, resp. rate 18, height 5' 11"  (1.803 m), weight 88.5 kg, SpO2 99 %. Body mass index is 27.2 kg/m.   Treatment Plan Summary: The patient is a 55 y.o. male with a history of  schizoaffective d/o bipolar type, who was initially admitted for inpatient psychiatric hospitalization on 11/12/2020 for management of violent behaviors, mood lability, and paranoia in the context of medication noncompliance.  Symptoms continue to improve on a combination of Depakote and Seroquel and patient is nearing readiness for discharge.  Daily contact with patient to assess and evaluate symptoms and progress in treatment and Medication  management   Continue every 15-minute observation level  Schizoaffective disorder bipolar type -Continue Seroquel 800 mg PO QHS for mood stabilization and psychotic symptoms -Continue Depakote DR 1000 mg every 12 hours (11/18/20: VPA level 72; AST 25, ALT 22; 11/23/20: WBC 4.2, H/H 14.0/41.1 and platelets 135)   Insomnia -Continue trazodone 100 mg nightly PRN insomnia  Continue treatment of medical issues per [facility]  Social work is working on aftercare plans.  Per social work report, patient's guardian is attempting to have patient discharged to residential program in [place].  I certify that inpatient services furnished can reasonably be expected to improve the patient's condition.  Arthor Captain, MD 11/26/2020, 2:47 PM

## 2020-11-26 NOTE — BHH Group Notes (Signed)
Occupational Therapy Group Note Date: 11/26/2020 Group Topic/Focus: Brain Fitness  Group Description: Group encouraged increased social engagement and participation through discussion/activity focused on brain fitness. Patients were provided education on various brain fitness activities/strategies, with explanation provided on the qualifying factors including: one, that is has to be challenging/hard and two, it has to be something that you do not do every day. Patients engaged actively during group session in various brain fitness activities to increase attention, concentration, and problem-solving skills. Discussion followed with a focus on identifying the benefits of brain fitness activities as use for adaptive coping strategies and distraction.    Therapeutic Goal(s): Identify benefit(s) of brain fitness activities as use for adaptive coping and healthy distraction. Identify specific brain fitness activities to engage in as use for adaptive coping and healthy distraction. Participation Level: Active   Participation Quality: Independent   Behavior: Cooperative and Interactive   Speech/Thought Process: Focused   Affect/Mood: Euthymic   Insight: Fair   Judgement: Fair   Individualization: Gordon Martin was active in their participation of group discussion/activity. He left in the middle of group to meet with MD, however was able to return and engage appropriately. Pt focused on discharge, though easily redirected to task.  Pt actively engaged in activity and offered several relevant responses and contributions.   Modes of Intervention: Activity, Discussion, Education and Problem-solving  Patient Response to Interventions:  Attentive, Engaged and Receptive   Plan: Continue to engage patient in OT groups 2 - 3x/week.  11/26/2020  Donne Hazel, MOT, OTR/L

## 2020-11-26 NOTE — BHH Counselor (Signed)
CSW and [facility] Supervisor Boris Lown met with this patient together to review Judithann Graves appeals process. Pt was provided with paperwork showing where Judithann Graves is at in the appeals process. CSW provided this patient with information about his upcoming treatment and travel plans.   Pt seemed to understand and requested to be informed if there are any changes.    Darletta Moll MSW, LCSW Clincal Social Worker  [facility]

## 2020-11-26 NOTE — B[facility] Group Notes (Signed)
B[facility] Group Notes:  (Nursing/MHT/Case Management/Adjunct)  Date:  11/26/2020  Time:  10:24 AM  Type of Therapy:  Group Therapy  Participation Level:  Active  Participation Quality:  Appropriate  Affect:  Appropriate  Cognitive:  Alert and Appropriate  Insight:  Appropriate and Good  Engagement in Group:  Engaged  Modes of Intervention:  Orientation  Summary of Progress/Problems: To be able to get discharged and to have his guardianship back because he does not want anybody to be in control over him.   Gordon Martin J Gordon Martin 11/26/2020, 10:24 AM

## 2020-11-27 NOTE — B[facility] Group Notes (Signed)
B[facility] Group Notes:  (Nursing/MHT/Case Management/Adjunct)  Date:  11/27/2020  Time:  9:55 AM  Type of Therapy:  Group Therapy  Participation Level:  Active  Participation Quality:  Appropriate  Affect:  Appropriate  Cognitive:  Alert and Appropriate  Insight:  Appropriate and Good  Engagement in Group:  Engaged  Modes of Intervention:  Orientation  Summary of Progress/Problems: His goal is to be discharged. He is frustrated that he was technically discharged days ago but it was overturned by his guardian.   Gordon Martin [name] 11/27/2020, 9:55 AM

## 2020-11-27 NOTE — BHH Counselor (Signed)
CSW contacted the charge nurse, Austin Miles. After pt left CSW Sharyl Nimrod a threatening voicemail and asked that pt be placed on phone restriction.   Fredirick Lathe, LCSWA Clinicial Social Worker [employer]

## 2020-11-27 NOTE — Progress Notes (Signed)
[facility] MD Progress Note  11/27/2020 12:50 PM Gordon Martin  MRN:  [id number]   Subjective: Patient states "I'm ready to go.  I don't get depressed, manic sometimes."  Focused on discharge as he states he cannot afford it because his legal guardian cancelled his Medicare.  He continues to be fixated on obtaining his guardianship from his legal guardian as he feels she is trying "different stunts" to keep his guardianship.  Objective: Medical record reviewed.  Patient's case discussed in detail with members of the treatment team today.  I met with and interviewed the patient this morning and he continues to perseverate on his guardian and her manipulating him along with being in prison for outstanding warrants.  He states he was not given water for seven days in one prison and and longer in another, "They did not feed me water."  Rambles with redirection needed.  Denies depression and reports he only gets manic at times, none currently.  He states his sleep has been poor since he suffered from head trauma yet slept from 9:30 pm to 5:30 last night, denies fatigue.  Reports a good appetite and "hoarding food" since he was "not fed" in prison and trying to gain back the weight of 55 pounds he states he lost.  Denies suicidal/homicidal ideations and relates to hearing spiritual voices at times as "I am very spiritual and in touch."  Some dry mouth and constipation related to his medications, encouraged water and PRN medications (fiber daily supplements and PRN Miralax) are in place for constipation.  Attending and participating in groups.  Interacting in the milieu appropriately.  Denies physical pain and discomfort.  Principal Problem: Schizoaffective disorder, bipolar type ([place]) Diagnosis: Principal Problem:   Schizoaffective disorder, bipolar type ([place])  Total Time spent with patient: 20 minutes  Past Psychiatric History: schizoaffective d/o bipolar type  Past Medical History:  Past Medical History:   Diagnosis Date  . Bipolar disorder ([place])   . Gout    History reviewed. No pertinent surgical history. Family History: History reviewed. No pertinent family history. Family Psychiatric  History: See H&P Social History:  Social History   Substance and Sexual Activity  Alcohol Use No     Social History   Substance and Sexual Activity  Drug Use No    Social History   Socioeconomic History  . Marital status: Married    Spouse name: Not on file  . Number of children: Not on file  . Years of education: Not on file  . Highest education level: Not on file  Occupational History  . Not on file  Tobacco Use  . Smoking status: Never Smoker  . Smokeless tobacco: Never Used  Substance and Sexual Activity  . Alcohol use: No  . Drug use: No  . Sexual activity: Not on file  Other Topics Concern  . Not on file  Social History Narrative  . Not on file   Social Determinants of Health   Financial Resource Strain: Not on file  Food Insecurity: Not on file  Transportation Needs: Not on file  Physical Activity: Not on file  Stress: Not on file  Social Connections: Not on file   Additional Social History:                         Sleep: Good  Appetite:  Good  Current Medications: Current Facility-Administered Medications  Medication Dose Route Frequency Provider Last Rate Last Admin  . acetaminophen (TYLENOL)  tablet 650 mg  650 mg Oral Q6H PRN Sharma Covert, MD      . allopurinol (ZYLOPRIM) tablet 100 mg  100 mg Oral BID Sharma Covert, MD   100 mg at 11/27/20 0758  . alum & mag hydroxide-simeth (MAALOX/MYLANTA) 200-200-20 MG/5ML suspension 30 mL  30 mL Oral Q4H PRN Sharma Covert, MD      . antiseptic oral rinse (BIOTENE) solution 15 mL  15 mL Mouth Rinse PRN Nelda Marseille, [name], MD      . divalproex (DEPAKOTE) DR tablet 1,000 mg  1,000 mg Oral BID AC & HS Lavella Hammock, MD   1,000 mg at 11/27/20 0801  . docusate sodium (COLACE) capsule 100 mg  100 mg  Oral BID Lavella Hammock, MD   100 mg at 11/27/20 0758  . feeding supplement (ENSURE ENLIVE / ENSURE PLUS) liquid 237 mL  237 mL Oral BID BM Sharma Covert, MD   237 mL at 11/26/20 1517  . fiber (NUTRISOURCE FIBER) 1 packet  1 packet Per Tube BID Lavella Hammock, MD   1 packet at 11/24/20 1656  . hydrOXYzine (ATARAX/VISTARIL) tablet 50 mg  50 mg Oral TID PRN Sharma Covert, MD   50 mg at 11/26/20 2044  . LORazepam (ATIVAN) tablet 1 mg  1 mg Oral Q6H PRN Harlow Asa, MD       Or  . LORazepam (ATIVAN) injection 1 mg  1 mg Intramuscular Q6H PRN Nelda Marseille, [name], MD      . magnesium citrate solution 0.5 Bottle  0.5 Bottle Oral Once PRN Nelda Marseille, [name], MD      . magnesium hydroxide (MILK OF MAGNESIA) suspension 30 mL  30 mL Oral Daily PRN Sharma Covert, MD      . multivitamin with minerals tablet 1 tablet  1 tablet Oral Daily Sharma Covert, MD   1 tablet at 11/27/20 0759  . omega-3 acid ethyl esters (LOVAZA) capsule 1 g  1 g Oral BID Sharma Covert, MD   1 g at 11/27/20 0758  . polyethylene glycol (MIRALAX / GLYCOLAX) packet 17 g  17 g Oral Daily PRN Lavella Hammock, MD   17 g at 11/25/20 [phone]  . QUEtiapine (SEROQUEL) tablet 800 mg  800 mg Oral QHS Viann Fish E, MD   800 mg at 11/26/20 2044  . traZODone (DESYREL) tablet 100 mg  100 mg Oral QHS PRN Sharma Covert, MD   100 mg at 11/26/20 2044  . Vitamin D3 (Vitamin D) tablet 1,000 Units  1,000 Units Oral Daily Sharma Covert, MD   1,000 Units at 11/27/20 0759  . ziprasidone (GEODON) injection 20 mg  20 mg Intramuscular Q12H PRN Harlow Asa, MD        Lab Results:  Results for orders placed or performed during the hospital encounter of 11/12/20 (from the past 48 hour(s))  Resp Panel by RT-PCR (Flu A&B, Covid) Nasopharyngeal Swab     Status: None   Collection Time: 11/26/20  2:45 PM   Specimen: Nasopharyngeal Swab; Nasopharyngeal(NP) swabs in vial transport medium  Result Value Ref Range   SARS  Coronavirus 2 by RT PCR NEGATIVE NEGATIVE    Comment: (NOTE) SARS-CoV-2 target nucleic acids are NOT DETECTED.  The SARS-CoV-2 RNA is generally detectable in upper respiratory specimens during the acute phase of infection. The lowest concentration of SARS-CoV-2 viral copies this assay can detect is 138 copies/mL. A negative result does not  preclude SARS-Cov-2 infection and should not be used as the sole basis for treatment or other patient management decisions. A negative result may occur with  improper specimen collection/handling, submission of specimen other than nasopharyngeal swab, presence of viral mutation(s) within the areas targeted by this assay, and inadequate number of viral copies(<138 copies/mL). A negative result must be combined with clinical observations, patient history, and epidemiological information. The expected result is Negative.  Fact Sheet for Patients:  EntrepreneurPulse.com.au  Fact Sheet for Healthcare Providers:  IncredibleEmployment.be  This test is no t yet approved or cleared by the [place] FDA and  has been authorized for detection and/or diagnosis of SARS-CoV-2 by FDA under an Emergency Use Authorization (EUA). This EUA will remain  in effect (meaning this test can be used) for the duration of the COVID-19 declaration under Section 564(b)(1) of the Act, 21 U.S.C.section 360bbb-3(b)(1), unless the authorization is terminated  or revoked sooner.       Influenza A by PCR NEGATIVE NEGATIVE   Influenza B by PCR NEGATIVE NEGATIVE    Comment: (NOTE) The Xpert Xpress SARS-CoV-2/FLU/RSV plus assay is intended as an aid in the diagnosis of influenza from Nasopharyngeal swab specimens and should not be used as a sole basis for treatment. Nasal washings and aspirates are unacceptable for Xpert Xpress SARS-CoV-2/FLU/RSV testing.  Fact Sheet for Patients: EntrepreneurPulse.com.au  Fact Sheet  for Healthcare Providers: IncredibleEmployment.be  This test is not yet approved or cleared by the [place] FDA and has been authorized for detection and/or diagnosis of SARS-CoV-2 by FDA under an Emergency Use Authorization (EUA). This EUA will remain in effect (meaning this test can be used) for the duration of the COVID-19 declaration under Section 564(b)(1) of the Act, 21 U.S.C. section 360bbb-3(b)(1), unless the authorization is terminated or revoked.  Performed at [facility], [place] [address], [place],  [address]     Blood Alcohol level:  Lab Results  Component Value Date   [facility] <10 11/11/2020   ETH <5 26/83/4196    Metabolic Disorder Labs: Lab Results  Component Value Date   HGBA1C 5.3 11/18/2020   MPG 105.41 11/18/2020   MPG 102.54 11/16/2020   No results found for: PROLACTIN Lab Results  Component Value Date   CHOL 167 11/18/2020   TRIG 230 (H) 11/18/2020   HDL 33 (L) 11/18/2020   CHOLHDL 5.1 11/18/2020   VLDL 46 (H) 11/18/2020   LDLCALC 88 11/18/2020   LDLCALC 73 11/16/2020    Physical Findings: AIMS: Facial and Oral Movements Muscles of Facial Expression: None, normal Lips and Perioral Area: None, normal Jaw: None, normal Tongue: None, normal,Extremity Movements Upper (arms, wrists, hands, fingers): None, normal Lower (legs, knees, ankles, toes): None, normal, Trunk Movements Neck, shoulders, hips: None, normal, Overall Severity Severity of abnormal movements (highest score from questions above): None, normal Incapacitation due to abnormal movements: None, normal Patient's awareness of abnormal movements (rate only patient's report): No Awareness, Dental Status Current problems with teeth and/or dentures?: No Does patient usually wear dentures?: No  CIWA:    COWS:     Musculoskeletal: Strength & Muscle Tone: within normal limits Gait & Station: normal Patient leans: N/A  Psychiatric Specialty  Exam:  Presentation  General Appearance: Casual  Eye Contact:Good  Speech:Clear and Coherent  Speech Volume:Normal  Handedness:Right   Mood and Affect  Mood:Euthymic ("Always good")  Affect:Congruent   Thought Process  Thought Processes:Coherent; Other (comment) (Circumstantial but redirectable)  Descriptions of Associations:Circumstantial  Orientation:Full (Time, Place  and Person)  Thought Content:Paranoid Ideation  History of Schizophrenia/Schizoaffective disorder:Yes  Duration of Psychotic Symptoms:Greater than six months  Hallucinations:Hallucinations: None  Ideas of Reference:Paranoia; Percusatory (Continues with paranoid persecutory themes about select family members and guardian but of decreased intensity per chart review)  Suicidal Thoughts:Suicidal Thoughts: No  Homicidal Thoughts:Homicidal Thoughts: No   Sensorium  Memory:Immediate Fair; Recent Fair; Remote Fair  Judgment:Poor  Insight:Shallow   Executive Functions  Concentration:Fair  Attention Span:Fair  [place]  Language:Good   Psychomotor Activity  Psychomotor Activity:Psychomotor Activity: Normal   Assets  Assets:Desire for Improvement; Armed forces logistics/support/administrative officer; Resilience  Sleep  Sleep:Sleep: Good  Physical Exam: Physical Exam Vitals and nursing note reviewed.  HENT:     Head: Normocephalic.  Neurological:     General: No focal deficit present.     Mental Status: He is alert and oriented to person, place, and time.    Review of Systems  Constitutional: Negative for diaphoresis and fever.  HENT: Negative for hearing loss.        Positive for dry mouth  Eyes: Negative for blurred vision.  Respiratory: Negative for cough.   Cardiovascular: Negative for chest pain.  Gastrointestinal: Positive for constipation. Negative for nausea and vomiting.  Genitourinary: Negative for dysuria.  Musculoskeletal: Negative for myalgias.  Neurological:  Negative for dizziness and tremors.  Psychiatric/Behavioral: Negative for depression, hallucinations and suicidal ideas. The patient does not have insomnia.    Blood pressure 123/82, pulse 80, temperature 98 F (36.7 C), temperature source Oral, resp. rate 18, height 5' 11"  (1.803 m), weight 88.5 kg, SpO2 100 %. Body mass index is 27.2 kg/m.   Treatment Plan Summary: The patient is a 55 y.o. male with a history of schizoaffective d/o bipolar type, who was initially admitted for inpatient psychiatric hospitalization on 11/12/2020 for management of violent behaviors, mood lability, and paranoia in the context of medication noncompliance.  Symptoms continue to improve on a combination of Depakote and Seroquel and patient is nearing readiness for discharge.  Daily contact with patient to assess and evaluate symptoms and progress in treatment and Medication management   Continue every 15-minute observation level  Schizoaffective disorder bipolar type -Continue Seroquel 800 mg PO QHS for mood stabilization and psychotic symptoms -Continue Depakote DR 1000 mg every 12 hours (11/18/20: VPA level 72; AST 25, ALT 22; 11/23/20: WBC 4.2, H/H 14.0/41.1 and platelets 135)   Insomnia -Continue trazodone 100 mg nightly PRN insomnia  Continue treatment of medical issues per [facility]  Social work is working on aftercare plans.  Per social work report, patient's guardian is attempting to have patient discharged to residential program in [place].  I certify that inpatient services furnished can reasonably be expected to improve the patient's condition.  Waylan Boga, NP 11/27/2020, 12:50 PM

## 2020-11-27 NOTE — Progress Notes (Addendum)
   11/27/20 2025  Psych Admission Type (Psych Patients Only)  Admission Status Involuntary  Psychosocial Assessment  Patient Complaints Worrying;Anxiety  Eye Contact Fair  Facial Expression Other (Comment) (appropriate)  Affect Preoccupied;Anxious  Speech Logical/coherent  Interaction Assertive  Motor Activity Other (Comment) (wnl)  Appearance/Hygiene Unremarkable  Behavior Characteristics Cooperative;Anxious  Mood Anxious;Pleasant  Thought Process  Coherency Concrete thinking  Content Blaming others  Delusions None reported or observed  Perception WDL  Hallucination None reported or observed  Judgment Limited  Confusion None  Danger to Self  Current suicidal ideation? Denies  Danger to Others  Danger to Others None reported or observed   Pt seen interacting in dayroom. Pt denies SI, HI, AVH and pain. Pt says he was anxious earlier because, "My social worker lied to me instead of telling me that the first appeal was flatly denied. Now, there has been a reissuance and they have up to 72 hours to decide. I wish they had told me this from the start. I was very anxious earlier but I'm good now. They didn't want me to make phone calls because I was reporting them." Pt c/o dry mouth from medications and given PRN mouthwash.

## 2020-11-27 NOTE — Progress Notes (Signed)
Recreation Therapy Notes  Date: 4.22.22 Time: 1000 Location: 500 Hall Dayroom  Group Topic: Communication, Team Building, Problem Solving   Goal Area(s) Addresses:  Patient will effectively work with peer towards shared goal.  Patient will identify skill used to make activity successful.  Patient will identify how skills used during activity can be used to reach post d/c goals.   Behavioral Response: Engaged  Intervention: Cup Merchant navy officer bands with attached strings enough for each group member, 10 or more cups  Activity: Patient(s) were given a set of solo cups, a rubber band, and some tied strings. The objective is to build a pyramid with the cups by only using the rubber band and string to move the cups. After the activity the patient(s) are LRT debriefed and discussed what strategies worked, what didn't, and what lessons they can take from the activity and use in life post discharge.   Education Areas: Social Skills, Support System, Discharge Planning   Education Outcome: Acknowledges education  Clinical Observations/Feedback: Patient was engaged and appropriate during group session.  Patient made many suggestions for the group to try.  Patient was skeptical the expected outcome would be accomplished.  Patient was called out of group to meet with doctor.  When patient returned, LRT was showing group an easier way they could have completed the activity.    Caroll Rancher, LRT/CTRS    Caroll Rancher A 11/27/2020 11:14 AM

## 2020-11-27 NOTE — Progress Notes (Signed)
Pt rates his anxiety, depression and hopelessness all 0/10. Visible in dayroom for all scheduled groups. Pt continues to be preoccupied about appealing his discharge which led to intermittent agitation and pt leaving threatening message on CSW's phone and to sue her as well. Pt is intrusive, hyper-verbal with pressured speech but is verbally redirectable. Educated on phone privileges on unit and warned about possible restriction for continued threats. Pt verbalized understanding. Pt remains medication compliant. Denies adverse drug reactions when assessed. Tolerates all PO intake well. Emotional support and encouragement provided to pt. All medications given as ordered and effects monitored. Safety checks maintained at Q 15 minutes intervals without self harm gestures. Pt is verbally redirectable at this time.

## 2020-11-27 NOTE — Tx Team (Signed)
Interdisciplinary Treatment and Diagnostic Plan Update  11/27/2020 Time of Session: 9:55am Gordon Martin MRN: [id number]  Principal Diagnosis: Schizoaffective disorder, bipolar type [facility])  Secondary Diagnoses: Principal Problem:   Schizoaffective disorder, bipolar type (HCC) Active Problems:   Constipation   Current Medications:  Current Facility-Administered Medications  Medication Dose Route Frequency Provider Last Rate Last Admin  . acetaminophen (TYLENOL) tablet 650 mg  650 mg Oral Q6H PRN [name], MD      . allopurinol (ZYLOPRIM) tablet 100 mg  100 mg Oral BID [name], MD   100 mg at 11/27/20 0758  . alum & mag hydroxide-simeth (MAALOX/MYLANTA) 200-200-20 MG/5ML suspension 30 mL  30 mL Oral Q4H PRN [name], MD      . antiseptic oral rinse (BIOTENE) solution 15 mL  15 mL Mouth Rinse PRN [name], [name], MD      . divalproex (DEPAKOTE) DR tablet 1,000 mg  1,000 mg Oral BID AC & HS Mariel Craft, MD   1,000 mg at 11/27/20 0801  . docusate sodium (COLACE) capsule 100 mg  100 mg Oral BID Mariel Craft, MD   100 mg at 11/27/20 0758  . feeding supplement (ENSURE ENLIVE / ENSURE PLUS) liquid 237 mL  237 mL Oral BID BM [name], MD   237 mL at 11/26/20 1517  . fiber (NUTRISOURCE FIBER) 1 packet  1 packet Per Tube BID Mariel Craft, MD   1 packet at 11/24/20 1656  . hydrOXYzine (ATARAX/VISTARIL) tablet 50 mg  50 mg Oral TID PRN [name], MD   50 mg at 11/26/20 2044  . LORazepam (ATIVAN) tablet 1 mg  1 mg Oral Q6H PRN [name], MD       Or  . LORazepam (ATIVAN) injection 1 mg  1 mg Intramuscular Q6H PRN [name], [name], MD      . magnesium citrate solution 0.5 Bottle  0.5 Bottle Oral Once PRN [name], [name], MD      . magnesium hydroxide (MILK OF MAGNESIA) suspension 30 mL  30 mL Oral Daily PRN [name], MD      . multivitamin with minerals tablet 1 tablet  1 tablet Oral Daily [name], MD   1  tablet at 11/27/20 0759  . omega-3 acid ethyl esters (LOVAZA) capsule 1 g  1 g Oral BID [name], MD   1 g at 11/27/20 0758  . polyethylene glycol (MIRALAX / GLYCOLAX) packet 17 g  17 g Oral Daily PRN Mariel Craft, MD   17 g at 11/25/20 [phone]  . QUEtiapine (SEROQUEL) tablet 800 mg  800 mg Oral QHS [name] E, MD   800 mg at 11/26/20 2044  . traZODone (DESYREL) tablet 100 mg  100 mg Oral QHS PRN [name], MD   100 mg at 11/26/20 2044  . Vitamin D3 (Vitamin D) tablet 1,000 Units  1,000 Units Oral Daily [name], MD   1,000 Units at 11/27/20 0759  . ziprasidone (GEODON) injection 20 mg  20 mg Intramuscular Q12H PRN [name], MD       PTA Medications: Medications Prior to Admission  Medication Sig Dispense Refill Last Dose  . allopurinol (ZYLOPRIM) 100 MG tablet Take 100 mg by mouth in the morning and at bedtime.   Past Month at Unknown time  . divalproex (DEPAKOTE) 500 MG DR tablet Take 1 tablet (500 mg total) by mouth 2 (two)  times daily before a meal. 1 tablet 0 Past Month at Unknown time  . QUEtiapine (SEROQUEL) 100 MG tablet Take 1 tablet by mouth at bedtime. (Patient not taking: Reported on 11/14/2020)   Not Taking at Unknown time    Patient Stressors: Financial difficulties Health problems Legal issue Marital or family conflict Medication change or noncompliance  Patient Strengths: Average or above average intelligence Capable of independent living Supportive family/friends  Treatment Modalities: Medication Management, Group therapy, Case management,  1 to 1 session with clinician, Psychoeducation, Recreational therapy.   Physician Treatment Plan for Primary Diagnosis: Schizoaffective disorder, bipolar type (HCC) Long Term Goal(s): Improvement in symptoms so as ready for discharge   Short Term Goals: Ability to identify changes in lifestyle to reduce recurrence of condition will improve Ability to demonstrate self-control will  improve Compliance with prescribed medications will improve  Medication Management: Evaluate patient's response, side effects, and tolerance of medication regimen.  Therapeutic Interventions: 1 to 1 sessions, Unit Group sessions and Medication administration.  Evaluation of Outcomes: Progressing  Physician Treatment Plan for Secondary Diagnosis: Principal Problem:   Schizoaffective disorder, bipolar type (HCC) Active Problems:   Constipation  Long Term Goal(s): Improvement in symptoms so as ready for discharge   Short Term Goals: Ability to identify changes in lifestyle to reduce recurrence of condition will improve Ability to demonstrate self-control will improve Compliance with prescribed medications will improve     Medication Management: Evaluate patient's response, side effects, and tolerance of medication regimen.  Therapeutic Interventions: 1 to 1 sessions, Unit Group sessions and Medication administration.  Evaluation of Outcomes: Progressing   RN Treatment Plan for Primary Diagnosis: Schizoaffective disorder, bipolar type (HCC) Long Term Goal(s): Knowledge of disease and therapeutic regimen to maintain health will improve  Short Term Goals: Ability to participate in decision making will improve, Ability to verbalize feelings will improve and Ability to identify and develop effective coping behaviors will improve  Medication Management: RN will administer medications as ordered by provider, will assess and evaluate patient's response and provide education to patient for prescribed medication. RN will report any adverse and/or side effects to prescribing provider.  Therapeutic Interventions: 1 on 1 counseling sessions, Psychoeducation, Medication administration, Evaluate responses to treatment, Monitor vital signs and CBGs as ordered, Perform/monitor CIWA, COWS, AIMS and Fall Risk screenings as ordered, Perform wound care treatments as ordered.  Evaluation of Outcomes:  Progressing   LCSW Treatment Plan for Primary Diagnosis: Schizoaffective disorder, bipolar type (HCC) Long Term Goal(s): Safe transition to appropriate next level of care at discharge, Engage patient in therapeutic group addressing interpersonal concerns.  Short Term Goals: Engage patient in aftercare planning with referrals and resources, Increase social support and Increase ability to appropriately verbalize feelings  Therapeutic Interventions: Assess for all discharge needs, 1 to 1 time with Social worker, Explore available resources and support systems, Assess for adequacy in community support network, Educate family and significant other(s) on suicide prevention, Complete Psychosocial Assessment, Interpersonal group therapy.  Evaluation of Outcomes: Progressing   Progress in Treatment: Attending groups: Yes. Participating in groups: Yes. Taking medication as prescribed: Yes. Toleration medication: Yes. Family/Significant other contact made: Yes, individual(s) contacted:  [name], LG Patient understands diagnosis: No. Discussing patient identified problems/goals with staff: Yes. Medical problems stabilized or resolved: Yes. Denies suicidal/homicidal ideation: Yes. Issues/concerns per patient self-inventory: No. Other: None  New problem(s) identified: No, Describe:  None  New Short Term/Long Term Goal(s):medication stabilization, elimination of SI thoughts, development of comprehensive mental wellness plan.  Patient Goals:  "move into [place]."  Discharge Plan or Barriers: Patient is to transfer from [facility] to longterm residential treatment per guardians wishes.   Reason for Continuation of Hospitalization: Delusions  Medication stabilization  Estimated Length of Stay: 3-5 days  Attendees: Patient: Gordon Martin 11/13/2020   Physician: Dr. Clifton Custard 11/13/2020   Nursing:  11/13/2020   RN Care Manager: 11/13/2020   Social Worker: Ruthann Cancer, LCSW  11/13/2020    Recreational Therapist:  11/13/2020   Other:  11/13/2020   Other:  11/13/2020   Other: 11/13/2020      Scribe for Treatment Team: Felizardo Hoffmann, LCSWA 11/27/2020 11:30 AM

## 2020-11-27 NOTE — [facility] Group Notes (Signed)
[facility] LCSW Group Therapy  11/27/2020 12:02 PM  Type of Therapy:  Group Therapy: Strengths Exploration   Participation Level:  Active  Participation Quality:  Appropriate  Affect:  Appropriate  Cognitive:  Appropriate  Insight:  Improving  Engagement in Therapy:  Engaged  Modes of Intervention:  Exploration  Summary of Progress/Problems: Patient attended and particiapted in group. Pt shared knowledge, creativity, and abilities are his strengths.   Sharyl Nimrod A Alessandria Henken 11/27/2020, 12:02 PM
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
## 2020-11-28 NOTE — Progress Notes (Signed)
Pt concerned about where he will go on D/c. Pt stated he wanted to go to the [place] to get help with housing. Pt given PRN Trazodone and Vistaril per MAR with HS medication . Pt visible on the unit this evening    11/28/20 2200  Psych Admission Type (Psych Patients Only)  Admission Status Involuntary  Psychosocial Assessment  Patient Complaints Worrying;Anxiety  Eye Contact Fair  Facial Expression Other (Comment) (appropriate)  Affect Preoccupied;Anxious  Speech Logical/coherent  Interaction Assertive  Motor Activity Restless;Pacing (wnl)  Appearance/Hygiene Unremarkable  Behavior Characteristics Cooperative  Mood Pleasant;Anxious  Thought Process  Coherency Concrete thinking  Content Blaming others  Delusions None reported or observed  Perception WDL  Hallucination None reported or observed  Judgment Limited  Confusion None  Danger to Self  Current suicidal ideation? Denies  Danger to Others  Danger to Others None reported or observed  Danger to Others Abnormal  Harmful Behavior to others No threats or harm toward other people  Destructive Behavior No threats or harm toward property
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
## 2020-11-29 NOTE — Progress Notes (Signed)
   11/29/20 0500  Sleep  Number of Hours 8.25   

## 2020-11-29 NOTE — Progress Notes (Signed)
   11/29/20 2216  Psych Admission Type (Psych Patients Only)  Admission Status Involuntary  Psychosocial Assessment  Patient Complaints None  Eye Contact Fair  Facial Expression Other (Comment) (appropriate)  Affect Preoccupied;Anxious  Speech Logical/coherent  Interaction Assertive  Motor Activity Restless;Pacing (wnl)  Appearance/Hygiene Unremarkable  Behavior Characteristics Cooperative  Mood Anxious  Thought Process  Coherency Concrete thinking  Content Blaming others  Delusions None reported or observed  Perception WDL  Hallucination None reported or observed  Judgment Limited  Confusion None  Danger to Self  Current suicidal ideation? Denies  Danger to Others  Danger to Others None reported or observed  Danger to Others Abnormal  Harmful Behavior to others No threats or harm toward other people  Destructive Behavior No threats or harm toward property  D: Patient in his room most of the evening but came out for snack and medication. Pt report he had a good day and is looking forward to discharge. Pt denies SI/HI and pain.  A: Medications administered as prescribed. Support and encouragement provided as needed.  R: Patient remains safe on the unit. Will continue to monitor for safety and stability.  h

## 2020-11-29 NOTE — [facility] Group Notes (Signed)
[facility] LCSW Group Therapy Note  Date/Time:  11/29/2020  11:00AM-12:00PM  Type of Therapy and Topic:  Group Therapy:  Music and Mood  Participation Level:  Active   Description of Group: In this process group, members listened to a variety of genres of music and identified that different types of music evoke different responses.  Patients were encouraged to identify music that was soothing for them and music that was energizing for them.  Patients discussed how this knowledge can help with wellness and recovery in various ways including managing depression and anxiety as well as encouraging healthy sleep habits.    Therapeutic Goals: 1. Patients will explore the impact of different varieties of music on mood 2. Patients will verbalize the thoughts they have when listening to different types of music 3. Patients will identify music that is soothing to them as well as music that is energizing to them 4. Patients will discuss how to use this knowledge to assist in maintaining wellness and recovery 5. Patients will explore the use of music as a coping skill  Summary of Patient Progress:  At the beginning of group, patient expressed that he felt disappointed.  At the end of group, patient expressed that he was still disappointed but also could acknowledge that he felt different, more relaxed.    Therapeutic Modalities: Solution Focused Brief Therapy Activity   Ambrose Mantle, LCSW
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
## 2020-11-29 NOTE — [facility] Group Notes (Signed)
[facility] Group Notes:  (Nursing/MHT/Case Management/Adjunct)  Date:  11/29/2020  Time:  9:19 AM  Type of Therapy:  Group Therapy  Participation Level:  Active  Participation Quality:  Appropriate  Affect:  Appropriate  Cognitive:  Alert  Insight:  Appropriate  Engagement in Group:  Engaged  Modes of Intervention:  Orientation  Summary of Progress/Problems: His goal for today is to find out a clear rationale as to why he has not been able to leave yet.  Glenda Kunst J Vasili Fok 11/29/2020, 9:19 AM

## 2020-11-29 NOTE — Progress Notes (Signed)
Pt continues to be animated, hyper-verbal, intrusive with staff and peers. Remains preoccupied with d/c and legal issues surrounding that. Observed making phone calls with loud, pressured speech to multiple lawyers "I just need to get out of here and they will not let me. I pay for that". Pt tolerates all medications and meals well. Slept well last night. Denies SI, HI, AVH and pain when assessed. Emotional support and encouragement offered to pt. All medications administered per order and effects monitored. Q 15 minutes safety checks maintained without incident.

## 2020-11-30 LAB — RESP PANEL BY RT-PCR (FLU A&B, COVID) ARPGX2
Influenza A by PCR: NEGATIVE
Influenza B by PCR: NEGATIVE
SARS Coronavirus 2 by RT PCR: NEGATIVE
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
## 2020-11-30 NOTE — [facility] Group Notes (Signed)
[facility] LCSW Group Therapy  Type of Therapy and Topic: Group Therapy - Healthy vs Unhealthy Coping Skills  Participation Level:Active  Description of Group The focus of this group was to determine what unhealthy coping techniques typically are used by group members and what healthy coping techniques would be helpful in coping with various problems. Patients were guided in becoming aware of the differences between healthy and unhealthy coping techniques. Patients were asked to identify 2-3 healthy coping skills they would like to learn to use more effectively.  Therapeutic Goals 1. Patients learned that coping is what human beings do all day long to deal with various situations in their lives 2. Patients defined and discussed healthy vs unhealthy coping techniques 3. Patients identified their preferred coping techniques and identified whether these were healthy or unhealthy 4. Patients determined 2-3 healthy coping skills they would like to become more familiar with and use more often. 5. Patients provided support and ideas to each other   Summary of Patient Progress:Gordon Martin participated in group and spoke with his peers about self-care and coping skills.  Gordon Martin spent time outside enjoying the warmth of the sun and nature and states that this is one of his healthy coping skills.    Therapeutic Modalities Cognitive Behavioral Therapy Motivational Interviewing

## 2020-11-30 NOTE — Progress Notes (Addendum)
   11/30/20 2156  Psych Admission Type (Psych Patients Only)  Admission Status Involuntary  Psychosocial Assessment  Patient Complaints Anxiety  Eye Contact Fair  Facial Expression Animated  Affect Preoccupied;Anxious  Speech Logical/coherent  Interaction Assertive  Motor Activity Pacing  Appearance/Hygiene Unremarkable  Behavior Characteristics Cooperative;Anxious  Mood Anxious;Preoccupied  Thought Process  Coherency Circumstantial  Content Blaming others;Delusions  Delusions Paranoid  Perception WDL  Hallucination None reported or observed  Judgment Limited  Confusion None  Danger to Self  Current suicidal ideation? Denies  Danger to Others  Danger to Others None reported or observed   Pt seen in dayroom. Pt denies SI, HI, AVH and pain. Pt ruminating on having to go to a facility in [place]. "I would rather stay here near the [place]." Pt talking about the cost of things. Pt rates anxiety 5/10. "They are sending me to [place] so I can't file charges against them here." Pt states that his anxiety was due to believing he was leaving today.

## 2020-11-30 NOTE — Progress Notes (Signed)
Adult Psychoeducational Group Note  Date:  11/30/2020 Time:  10:16 PM  Group Topic/Focus:  Wrap-Up Group:   The focus of this group is to help patients review their daily goal of treatment and discuss progress on daily workbooks.  Participation Level:  Minimal  Participation Quality:  Appropriate  Affect:  Appropriate  Cognitive:  Appropriate  Insight: Limited  Engagement in Group:  Engaged  Modes of Intervention:  Education  Additional Comments:  Patient attended and participated in group tonight.  Lita Mains [facility] 11/30/2020, 10:16 PM

## 2020-11-30 NOTE — [facility] Group Notes (Signed)
[facility] Group Notes:  (Nursing/MHT/Case Management/Adjunct)  Date:  11/30/2020  Time:  9:31 AM  Type of Therapy:  Group Therapy  Participation Level:  Active  Participation Quality:  Appropriate  Affect:  Anxious and Appropriate  Cognitive:  Alert and Appropriate  Insight:  Appropriate and Good  Engagement in Group:  Engaged  Modes of Intervention:  Orientation  Summary of Progress/Problems: His goal for today is to get discharged. Feels like he is being held against his will since a doctor had attempted to discharge him earlier this month but was unsuccessful due to his guardian that appealed his discharge.   Rumaysa Sabatino J Idrissa Beville 11/30/2020, 9:31 AM

## 2020-11-30 NOTE — Progress Notes (Signed)
Recreation Therapy Notes  Date: 4.25.22 Time: 1000 Location: 500 Hall Dayroom  Group Topic: Coping Skills  Goal Area(s) Addresses:  Patient will identify positive coping strategies. Patient will identify benefits of positive coping strategies. Patient will identify negative coping strategies. Patient will identify setbacks of using negative coping strategies.  Behavioral Response: Minimal, Flat  Intervention: Worksheet, Pencils  Activity: Healthy vs. Unhealthy Coping Strategies.  Patients were to identify Martin problem they are currently facing.  Patients were to identify unhealthy coping strategies they have used and consequences of it.  Patients would then identify healthy coping strategies, expected outcomes and barriers to using these healthy coping strategies.  Education: Pharmacologist, Building control surveyor.   Education Outcome: Acknowledges understanding/In group clarification offered/Needs additional education.   Clinical Observations/Feedback: Pt wasn't his usually energetic self and seemed down.  Pt wasn't as talkative as he usually is in group.  Pt identified current problem as "stress and distress over discharge".  Pt expressed this causes stress which leads to poor health.  The only positive pt could come up with was, not worrying about it which would help to live longer and cause less stress.    Gordon Martin, LRT/CTRS    Gordon Martin, Gordon Martin 11/30/2020 11:22 AM
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
## 2020-12-01 NOTE — Progress Notes (Signed)
Recreation Therapy Notes  INPATIENT RECREATION TR PLAN  Patient Details Name: Gordon Martin MRN: [id number] DOB: 1966/03/29 Today's Date: 12/01/2020  Rec Therapy Plan Is patient appropriate for Therapeutic Recreation?: Yes Treatment times per week: about 3 days Estimated Length of Stay: 5-7 days TR Treatment/Interventions: Group participation (Comment)  Discharge Criteria Pt will be discharged from therapy if:: Discharged Treatment plan/goals/alternatives discussed and agreed upon by:: Patient/family  Discharge Summary Short term goals set: See patient care plan Short term goals met: Complete Progress toward goals comments: Groups attended Which groups?: Wellness,Self-esteem,Coping skills,Leisure education,Communication,Other (Comment) (Team Building; Triggers) Reason goals not met: None Therapeutic equipment acquired: N/A Reason patient discharged from therapy: Discharge from hospital Pt/family agrees with progress & goals achieved: Yes Date patient discharged from therapy: 12/01/20   Victorino Sparrow, LRT/CTRS   Ria Comment, [place] 12/01/2020, 11:30 AM

## 2020-12-01 NOTE — Progress Notes (Signed)
SPIRITUALITY GROUP NOTE  Spirituality group facilitated by Wilkie Aye, MDiv, BCC.  Group Description:  Group focused on topic of hope.  Patients participated in facilitated discussion around topic, connecting with one another around experiences and definitions for hope.  Group members engaged with visual explorer photos, reflecting on what hope looks like for them today.  Group engaged in discussion around how their definitions of hope are present today in hospital.   Modalities: Psycho-social ed, Adlerian, Narrative, MI Patient Progress: Pt was preset for group  Pt spoke about that hope can be very hard to find sometimes   Leane Para  Counseling Intern @ [facility]

## 2020-12-01 NOTE — Progress Notes (Signed)
Recreation Therapy Notes  Date: 4.26.22 Time: 1000 Location: 500 Hall Dayroom  Group Topic: Wellness  Goal Area(s) Addresses:  Patient will define components of whole wellness. Patient will verbalize benefit of whole wellness.  Behavioral Response: Engaged  Intervention: Exercise  Activity:  Patients participated in a range of exercises.  Each patient had the opportunity to pick stretches and exercises of their choosing to lead the group in.  The goal was to get at least 30 minutes of active movement to loosen the muscles, clear the mind and let go of any frustrations.  After exercising patients were given two worksheets.  One worksheet gave examples of some things patients can do to help with total wellbeing such as eating right, taking a walk, getting proper sleep, etc.  The other sheet taught patients about the different types of exercise (anaerobic and aerobic).  It also left space for patients to plan out when they can exercise and the types of exercise they want to do.    Education: Wellness, Building control surveyor.   Education Outcome: Acknowledges education/In group clarification offered/Needs additional education.   Clinical Observations/Feedback:  Pt was bright during group.  Pt completed some of the exercises.  Pt eventually sat down and complained about not wanting to get all sweaty.  Pt left but came back to receive his worksheets.     Caroll Rancher, LRT/CTRS     Lillia Abed, Bernita Beckstrom A 12/01/2020 11:01 AM

## 2020-12-01 NOTE — Progress Notes (Signed)
  [facility] Adult Case Management Discharge Plan :  Will you be returning to the same living situation after discharge:  No. Will be transferred to [facility] in [place].  At discharge, do you have transportation home?: Yes,  [facility] to pick up this pt. Do you have the ability to pay for your medications: Yes,  has insurance and income  Release of information consent forms completed and in the chart;  Patient's signature needed at discharge.  Patient to Follow up at:  Follow-up Information    [facility] Go on 11/30/2020.   Why: You have a hospital follow up appointment for therapy and medication management services on 11/30/20 at 9:00 am.   This appointment will be held in person.  Contact information: [address] [facility] [place] [address] [phone]               Next level of care provider has access to [facility] Link:no  Safety Planning and Suicide Prevention discussed: Yes,  witrh guardian.     Has patient been referred to the Quitline?: Patient refused referral  Patient has been referred for addiction treatment: N/A  Otelia Santee, LCSW 12/01/2020, 8:47 AM

## 2020-12-01 NOTE — [facility] Group Notes (Signed)
[facility] Group Notes:  (Nursing/MHT/Case Management/Adjunct)  Date:  12/01/2020  Time:  8:39 AM  Type of Therapy:  Group Therapy  Participation Level:  Active  Participation Quality:  Appropriate  Affect:  Appropriate  Cognitive:  Alert and Appropriate  Insight:  Appropriate and Improving  Engagement in Group:  Engaged  Modes of Intervention:  Orientation  Summary of Progress/Problems: His goal for today is to be able to get discharged to [facility] and get admitted to a [place] hospital and to get his medications back on track.  Iran Kievit J Marthella Osorno 12/01/2020, 8:39 AM

## 2020-12-01 NOTE — Discharge Summary (Signed)
Physician Discharge Summary Note  Patient:  Gordon Martin is an 55 y.o., male MRN:  [id number] DOB:  Nov 14, 1965 Patient phone:  [phone] (home)  Patient address:   [address] [place] [id number],  Total Time spent with patient: 30 minutes  Date of Admission:  11/12/2020 Date of Discharge: 12/01/2020  Reason for Admission:  (From MD's admission note): Patient is a 55 year old male with a reported past psychiatric history significant for schizoaffective disorder; bipolar type who was [facility] to the [facility] emergency department on 11/11/2020 under involuntary commitment. His sister apparently took out involuntary commitment paperwork. The involuntary commitment paperwork stated that he had a history of violent behavior. It says in the notes that he [place] [facility] after making bomb threats at a local high school. He does have a history of violent behavior. He apparently was not taking his medicines. He stated that he had been in jail over the last 100 days or more. He was given medication, but he does not remember what it was. He stated that they were not giving him the Seroquel and Depakote which she had [facility]. He denies suicidal or homicidal ideation. He denied any auditory or visual hallucinations. He stated that his sister took out the involuntary commitment paperwork because "she is against me". He stated that his sister has apparently "vacated his apartment", but he states he has access to an assisted living facility which was arranged for him to go to after he was released from prison. He stated he had been in prison multiple times in the past. Trying to inquire what had gone on most recently. Review of the electronic medical record revealed a [facility] in October 2021. He stated that after that he was [facility]. Shortly after he was  [facility] he apparently went to jail. His most recent reason for imprisonment was related to threaten a Diplomatic Services operational officersecretary at a local high school. He does have some paranoia, but very vague. He is a terrible historian. He stated that he has had over 30 hospitalizations in the past. [facility] has [facility], [facility], [facility], other hospitals throughout the state. The records from the [facility] hospitalization had them on Seroquel and lithium. He stated that he had "a heart attack on lithium". 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 He was admitted to the hospital for evaluation and stabilization.  Evaluation on the unit, day of discharge: Patient denies SI/HI/AVH, paranoia and delusions. He is taking his medications as prescribed without complaint of side effects. Patient is eating and sleeping well. He is attending group therapy and working on coping skills. Patient is discharging to [facility] [place] today. He will travel by airplane and will be accompanied by his guardian. His vital signs are stable, he is afebrile. He had no new labs today. Patient is stable for discharge today.    Principal Problem: Schizoaffective disorder, bipolar type [facility](HCC) Discharge Diagnoses: Principal Problem:   Schizoaffective disorder, bipolar type [facility](HCC)   Past Psychiatric History: See H&P  Past Medical History:  Past Medical History:  Diagnosis Date  . Bipolar disorder (HCC)   . Gout    History reviewed. No pertinent surgical history. Family History: History reviewed. No pertinent family history. Family Psychiatric  History: See H&P Social History:  Social History  Substance and Sexual Activity  Alcohol Use No     Social History   Substance and Sexual Activity  Drug Use No    Social History   Socioeconomic History  . Marital status: Married    Spouse name: Not on file  . Number of children: Not on file  .  Years of education: Not on file  . Highest education level: Not on file  Occupational History  . Not on file  Tobacco Use  . Smoking status: Never Smoker  . Smokeless tobacco: Never Used  Substance and Sexual Activity  . Alcohol use: No  . Drug use: No  . Sexual activity: Not on file  Other Topics Concern  . Not on file  Social History Narrative  . Not on file   Social Determinants of Health   Financial Resource Strain: Not on file  Food Insecurity: Not on file  Transportation Needs: Not on file  Physical Activity: Not on file  Stress: Not on file  Social Connections: Not on file    Hospital Course:  Gordon Martin is  a 55 year old male with a reported past psychiatric history significant for schizoaffective disorder; bipolar type who was brought to the [facility] emergency department on 11/11/2020 under involuntary commitment  after making bomb threats at a local high school. He does have a history of violent behavior. He was admitted to the [facility] hall for stabilization and medication management.  After the above admission evaluation, Gordon Martin presenting symptoms were noted. He was recommended for mood stabilization treatments. The medication regimen targeting those presenting symptoms were discussed with him & initiated with his consent. His UDS and BAL on arrival to the ED were negative.   He was however medicated, stabilized & discharged on the medications as listed on his discharge medication list below. His medications, Seroquel , Depakote and  Trazodone were beneficial in stabilization. He is calm and cooperative.  Besides the mood stabilization treatments, Gordon Martin was also enrolled & participated in the group counseling sessions being offered & held on this unit. He learned coping skills. He presented no other significant pre-existing medical issues that required treatment. He tolerated his treatment regimen without any adverse effects or reactions reported.   During the  course of his hospitalization, the 15-minute checks were adequate to ensure patient's safety. Kiara did not display any dangerous, violent or suicidal behavior on the unit.  He interacted with patients & staff appropriately, participated appropriately in the group sessions/therapies. His medications were addressed & adjusted to meet his needs. He was recommended for outpatient follow-up care & medication management upon discharge to assure continuity of care & mood stability. Today,  he will travel with his guardian to a [facility] facility in [place] for continued residential treatment. At the time of discharge patient is not reporting any acute suicidal/homicidal ideations. He feels more confident about his self-care & in managing his mental health. He currently denies any new issues or concerns. Education and supportive counseling provided throughout his hospital stay & upon discharge.   Today upon his discharge evaluation with the attending psychiatrist, Uzziah shares he is doing well. He denies any other specific concerns. He is sleeping well. His appetite is good. He denies other physical complaints. He denies AH/VH, delusional thoughts or paranoia. He does not appear to be responding to any internal stimuli. He feels that his medications have been helpful & is in agreement to continue his current treatment regimen as recommended. He was able to engage in  safety planning including plan to return to [facility] or contact emergency services if he feels unable to maintain his own safety or the safety of others. Pt had no further questions, comments, or concerns. He left [facility] with all personal belongings in no apparent distress. Transportation per [employer].   Physical Findings: AIMS: Facial and Oral Movements Muscles of Facial Expression: None, normal Lips and Perioral Area: None, normal Jaw: None, normal Tongue: None, normal,Extremity Movements Upper (arms, wrists, hands, fingers): None, normal Lower  (legs, knees, ankles, toes): None, normal, Trunk Movements Neck, shoulders, hips: None, normal, Overall Severity Severity of abnormal movements (highest score from questions above): None, normal Incapacitation due to abnormal movements: None, normal Patient's awareness of abnormal movements (rate only patient's report): No Awareness, Dental Status Current problems with teeth and/or dentures?: No Does patient usually wear dentures?: No  CIWA:    COWS:     Musculoskeletal: Strength & Muscle Tone: within normal limits Gait & Station: normal Patient leans: N/A  Psychiatric Specialty Exam:  Presentation  General Appearance: Casual  Eye Contact:Good  Speech:Normal Rate  Speech Volume:Normal  Handedness:Right  Mood and Affect  Mood:Euthymic  Affect:Congruent  Thought Process  Thought Processes:Goal Directed  Descriptions of Associations:Circumstantial  Orientation:Full (Time, Place and Person)  Thought Content:Delusions; Paranoid Ideation  History of Schizophrenia/Schizoaffective disorder:Yes  Duration of Psychotic Symptoms:Greater than six months  Hallucinations:Hallucinations: Auditory  Ideas of Reference:Delusions; Paranoia  Suicidal Thoughts:Suicidal Thoughts: No  Homicidal Thoughts:Homicidal Thoughts: No  Sensorium  Memory:Immediate Fair; Recent Fair  Judgment:Fair  Insight:Fair  Executive Functions  Concentration:Fair  Attention Span:Fair  Recall:Fair  Fund of Knowledge:Fair  Language:Fair  Psychomotor Activity  Psychomotor Activity:Psychomotor Activity: Normal  Assets  Assets:Desire for Improvement; Resilience; Housing  Sleep  Sleep:Sleep: Good Number of Hours of Sleep: 7.5  Physical Exam: Physical Exam Vitals and nursing note reviewed.  Constitutional:      Appearance: Normal appearance.  HENT:     Head: Normocephalic.  Pulmonary:     Effort: Pulmonary effort is normal.  Musculoskeletal:        General: Normal range of  motion.     Cervical back: Normal range of motion.  Neurological:     Mental Status: He is alert and oriented to person, place, and time.  Psychiatric:        Attention and Perception: Attention normal. He does not perceive auditory or visual hallucinations.        Mood and Affect: Mood normal.        Speech: Speech normal.        Behavior: Behavior normal. Behavior is cooperative.        Thought Content: Thought content normal. Thought content is not paranoid or delusional. Thought content does not include homicidal or suicidal ideation. Thought content does not include homicidal or suicidal plan.        Cognition and Memory: Cognition normal.    Review of Systems  Constitutional: Negative for fever.  HENT: Negative for congestion and sore throat.   Respiratory: Negative for cough and shortness of breath.   Cardiovascular: Negative for chest pain.  Gastrointestinal: Negative.   Genitourinary: Negative.   Musculoskeletal: Negative.    Blood pressure 111/77, pulse 80, temperature 98 F (36.7 C), temperature source Oral, resp. rate 18, height  (1.803 m), weight 88.5 kg, SpO2 100 %. Body mass index is 27.2 kg/m.      Has this patient used any form of tobacco in the last 30 days? (Cigarettes, Smokeless Tobacco, Cigars, and/or  Pipes) Yes, N/A  Blood Alcohol level:  Lab Results  Component Value Date   ETH <10 11/11/2020   ETH <5 09/11/2014    Metabolic Disorder Labs:  Lab Results  Component Value Date   HGBA1C 5.3 11/18/2020   MPG 105.41 11/18/2020   MPG 102.54 11/16/2020   No results found for: PROLACTIN Lab Results  Component Value Date   CHOL 167 11/18/2020   TRIG 230 (H) 11/18/2020   HDL 33 (L) 11/18/2020   CHOLHDL 5.1 11/18/2020   VLDL 46 (H) 11/18/2020   LDLCALC 88 11/18/2020   LDLCALC 73 11/16/2020    See Psychiatric Specialty Exam and Suicide Risk Assessment completed by Attending Physician prior to discharge.  Discharge destination:  Other:  [place]  facility  Is patient on multiple antipsychotic therapies at discharge:  No   Has Patient had three or more failed trials of antipsychotic monotherapy by history:  No  Recommended Plan for Multiple Antipsychotic Therapies: NA  Discharge Instructions    Diet - low sodium heart healthy   Complete by: As directed    Increase activity slowly   Complete by: As directed      Allergies as of 12/01/2020      Reactions   Bee Venom    Hornets   Haloperidol And Related Other (See Comments)   Lock jaw   Risperidone And Related Other (See Comments)   unknown   Zyprexa Relprevv [olanzapine Pamoate] Other (See Comments)   unknown      Medication List    TAKE these medications     Indication  allopurinol 100 MG tablet Commonly known as: ZYLOPRIM Take 1 tablet (100 mg total) by mouth 2 (two) times daily. What changed: when to take this  Indication: Gout   divalproex 500 MG DR tablet Commonly known as: DEPAKOTE Take 2 tablets (1,000 mg total) by mouth 2 (two) times daily at 8 am and 10 pm. What changed:   how much to take  when to take this  Indication: schizoaffective disorder   docusate sodium 100 MG capsule Commonly known as: COLACE Take 1 capsule (100 mg total) by mouth 2 (two) times daily.  Indication: Constipation   hydrOXYzine 50 MG tablet Commonly known as: ATARAX/VISTARIL Take 1 tablet (50 mg total) by mouth 3 (three) times daily as needed for anxiety.  Indication: Feeling Anxious   multivitamin with minerals Tabs tablet Take 1 tablet by mouth daily.  Indication: Nutritional Support   omega-3 acid ethyl esters 1 g capsule Commonly known as: LOVAZA Take 1 capsule (1 g total) by mouth 2 (two) times daily.  Indication: High Amount of Triglycerides in the Blood   QUEtiapine 400 MG tablet Commonly known as: SEROQUEL Take 2 tablets (800 mg total) by mouth at bedtime. What changed:   medication strength  how much to take  Another medication with the same name  was removed. Continue taking this medication, and follow the directions you see here.  Indication: mood stabilization   traZODone 100 MG tablet Commonly known as: DESYREL Take 1 tablet (100 mg total) by mouth at bedtime as needed for sleep.  Indication: Trouble Sleeping   Vitamin D3 25 MCG tablet Commonly known as: Vitamin D Take 1 tablet (1,000 Units total) by mouth daily.  Indication: Vitamin D Deficiency       Follow-up Information    [facility] [place]. Go to.   Why: You have an intake with [facility] in [place] at discharge.  Contact information: [address] [place], [place] [address]  [phone]              Follow-up recommendations:  Activity:  as tolerated Diet:  Heart Healthy  Comments:  Prescriptions given at discharge. Patient received sample medications to bridge him over until his new facility can fill his medications for him.  Patient agreeable to the discharge plan.  Given opportunity to ask questions.  He appears to feel comfortable with discharge denies any current suicidal or homicidal thoughts.   Patient is instructed prior to discharge to: Take all medications as prescribed by his mental healthcare provider. Report any adverse effects and or reactions from the medicines to his outpatient provider promptly. Patient has been instructed & cautioned: To not engage in alcohol and or illegal drug use while on prescription medicines. In the event of worsening symptoms, patient is instructed to call the crisis hotline, 911 and or go to the nearest ED for appropriate evaluation and treatment of symptoms. To follow-up with his primary care provider for your other medical issues, concerns and or health care needs.   Signed: Laveda Abbe, NP 12/01/2020, 10:13 AM

## 2020-12-01 NOTE — Progress Notes (Signed)
   12/01/20 0559  Vital Signs  Temp 98 F (36.7 C)  Temp Source Oral  Pulse Rate 70  BP 133/78  BP Location Left Arm  BP Method Automatic  Patient Position (if appropriate) Sitting  Oxygen Therapy  SpO2 100 %  Discharge Note:  Patient denies SI/HI AVH at this time. Discharge instructions, AVS, prescriptions, smaples and transition record gone over with patient. Patient agrees to comply with medication management, follow-up visit, and outpatient therapy. Patient belongings returned to patient. Patient questions and concerns addressed and answered.  Patient ambulatory off unit.  Patient discharged to [place] with escorts.

## 2020-12-01 NOTE — [facility] Suicide Risk Assessment (Signed)
[facility] Discharge Suicide Risk Assessment   Principal Problem: Schizoaffective disorder, bipolar type [facility]) Discharge Diagnoses: Principal Problem:   Schizoaffective disorder, bipolar type (HCC)   Total Time spent with patient: 15 minutes  Musculoskeletal: Strength & Muscle Tone: within normal limits Gait & Station: normal Patient leans: N/A  Psychiatric Specialty Exam: Review of Systems  All other systems reviewed and are negative.   Blood pressure 111/77, pulse 80, temperature 98 F (36.7 C), temperature source Oral, resp. rate 18, height 5\' 11"  (1.803 m), weight 88.5 kg, SpO2 100 %.Body mass index is 27.2 kg/m.  General Appearance: Casual  Eye Contact::  Fair  Speech:  Normal Rate409  Volume:  Normal  Mood:  Euthymic  Affect:  Congruent  Thought Process:  Coherent and Descriptions of Associations: Circumstantial  Orientation:  Full (Time, Place, and Person)  Thought Content:  Logical  Suicidal Thoughts:  No  Homicidal Thoughts:  No  Memory:  Immediate;   Fair Recent;   Fair Remote;   Fair  Judgement:  Fair  Insight:  Fair  Psychomotor Activity:  Normal  Concentration:  Fair  Recall:  [phone] of Knowledge:Fair  Language: Fair  Akathisia:  Negative  Handed:  Right  AIMS (if indicated):     Assets:  Desire for Improvement Housing Resilience  Sleep:  Number of Hours: 7.5  Cognition: WNL  ADL's:  Intact   Mental Status Per Nursing Assessment::   On Admission:  NA  Demographic Factors:  Male, Divorced or widowed, Caucasian, Low socioeconomic status and Unemployed  Loss Factors: Decrease in vocational status, Legal issues and Financial problems/change in socioeconomic status  Historical Factors: Impulsivity  Risk Reduction Factors:   NA  Continued Clinical Symptoms:  Bipolar Disorder:   Mixed State Schizophrenia:   Paranoid or undifferentiated type  Cognitive Features That Contribute To Risk:  Thought constriction (tunnel vision)    Suicide Risk:   Minimal: No identifiable suicidal ideation.  Patients presenting with no risk factors but with morbid ruminations; may be classified as minimal risk based on the severity of the depressive symptoms   Follow-up Information    [facility] Go on 11/30/2020.   Why: You have a hospital follow up appointment for therapy and medication management services on 11/30/20 at 9:00 am.   This appointment will be held in person.  Contact information: [address] [facility] [place] [id number]               Plan Of Care/Follow-up recommendations:  Activity:  ad lib  [phone], MD 12/01/2020, 7:28 AM

## 2020-12-01 NOTE — Plan of Care (Signed)
Pt was able to engaged in groups without prompting or encouragement at completion of recreation therapy group sessions.    Caroll Rancher, LRT/CTRS
